# Patient Record
Sex: Female | Born: 1960 | Race: White | Hispanic: No | Marital: Married | Smoking: Never smoker
Health system: Southern US, Community
[De-identification: ages and names within clinical notes are randomized; demographics above are authoritative.]

## PROBLEM LIST (undated history)

## (undated) DIAGNOSIS — F419 Anxiety disorder, unspecified: Secondary | ICD-10-CM

## (undated) DIAGNOSIS — D649 Anemia, unspecified: Secondary | ICD-10-CM

## (undated) DIAGNOSIS — E119 Type 2 diabetes mellitus without complications: Secondary | ICD-10-CM

## (undated) DIAGNOSIS — K219 Gastro-esophageal reflux disease without esophagitis: Secondary | ICD-10-CM

## (undated) DIAGNOSIS — E611 Iron deficiency: Secondary | ICD-10-CM

## (undated) DIAGNOSIS — IMO0001 Reserved for inherently not codable concepts without codable children: Secondary | ICD-10-CM

## (undated) HISTORY — DX: Anxiety disorder, unspecified: F41.9

## (undated) HISTORY — DX: Iron deficiency: E61.1

## (undated) HISTORY — DX: Type 2 diabetes mellitus without complications: E11.9

## (undated) HISTORY — DX: Anemia, unspecified: D64.9

## (undated) HISTORY — PX: TONSILLECTOMY: SUR1361

## (undated) HISTORY — DX: Reserved for inherently not codable concepts without codable children: IMO0001

## (undated) HISTORY — DX: Gastro-esophageal reflux disease without esophagitis: K21.9

---

## 1998-12-25 ENCOUNTER — Other Ambulatory Visit: Admission: RE | Admit: 1998-12-25 | Discharge: 1998-12-25 | Payer: Self-pay | Admitting: Family Medicine

## 2000-11-18 ENCOUNTER — Encounter: Admission: RE | Admit: 2000-11-18 | Discharge: 2000-11-18 | Payer: Self-pay | Admitting: Family Medicine

## 2000-11-18 ENCOUNTER — Encounter: Payer: Self-pay | Admitting: Family Medicine

## 2001-01-20 ENCOUNTER — Other Ambulatory Visit: Admission: RE | Admit: 2001-01-20 | Discharge: 2001-01-20 | Payer: Self-pay | Admitting: Family Medicine

## 2002-01-23 ENCOUNTER — Encounter: Admission: RE | Admit: 2002-01-23 | Discharge: 2002-01-23 | Payer: Self-pay | Admitting: Family Medicine

## 2002-01-23 ENCOUNTER — Encounter: Payer: Self-pay | Admitting: Family Medicine

## 2002-02-09 ENCOUNTER — Other Ambulatory Visit: Admission: RE | Admit: 2002-02-09 | Discharge: 2002-02-09 | Payer: Self-pay | Admitting: Family Medicine

## 2003-01-29 ENCOUNTER — Encounter: Admission: RE | Admit: 2003-01-29 | Discharge: 2003-01-29 | Payer: Self-pay | Admitting: Family Medicine

## 2003-02-15 ENCOUNTER — Other Ambulatory Visit: Admission: RE | Admit: 2003-02-15 | Discharge: 2003-02-15 | Payer: Self-pay | Admitting: Family Medicine

## 2004-02-01 ENCOUNTER — Ambulatory Visit (HOSPITAL_COMMUNITY): Admission: RE | Admit: 2004-02-01 | Discharge: 2004-02-01 | Payer: Self-pay | Admitting: Family Medicine

## 2004-03-31 ENCOUNTER — Ambulatory Visit: Payer: Self-pay | Admitting: Family Medicine

## 2004-04-04 ENCOUNTER — Other Ambulatory Visit: Admission: RE | Admit: 2004-04-04 | Discharge: 2004-04-04 | Payer: Self-pay | Admitting: Family Medicine

## 2004-04-04 ENCOUNTER — Ambulatory Visit: Payer: Self-pay | Admitting: Family Medicine

## 2005-02-02 ENCOUNTER — Ambulatory Visit (HOSPITAL_COMMUNITY): Admission: RE | Admit: 2005-02-02 | Discharge: 2005-02-02 | Payer: Self-pay | Admitting: Family Medicine

## 2005-04-06 ENCOUNTER — Ambulatory Visit: Payer: Self-pay | Admitting: Family Medicine

## 2005-04-13 ENCOUNTER — Ambulatory Visit: Payer: Self-pay | Admitting: Family Medicine

## 2005-04-13 ENCOUNTER — Other Ambulatory Visit: Admission: RE | Admit: 2005-04-13 | Discharge: 2005-04-13 | Payer: Self-pay | Admitting: Family Medicine

## 2005-07-31 ENCOUNTER — Ambulatory Visit: Payer: Self-pay | Admitting: Family Medicine

## 2006-02-08 ENCOUNTER — Ambulatory Visit (HOSPITAL_COMMUNITY): Admission: RE | Admit: 2006-02-08 | Discharge: 2006-02-08 | Payer: Self-pay | Admitting: Family Medicine

## 2006-04-20 ENCOUNTER — Ambulatory Visit: Payer: Self-pay | Admitting: Family Medicine

## 2006-04-20 LAB — CONVERTED CEMR LAB
ALT: 19 units/L (ref 0–40)
Basophils Relative: 0.2 % (ref 0.0–1.0)
Bilirubin, Direct: 0.1 mg/dL (ref 0.0–0.3)
CO2: 27 meq/L (ref 19–32)
Calcium: 9.7 mg/dL (ref 8.4–10.5)
Eosinophils Absolute: 0.2 10*3/uL (ref 0.0–0.6)
Eosinophils Relative: 3.4 % (ref 0.0–5.0)
GFR calc Af Amer: 116 mL/min
Glucose, Bld: 122 mg/dL — ABNORMAL HIGH (ref 70–99)
HCT: 39.1 % (ref 36.0–46.0)
Hemoglobin: 13.5 g/dL (ref 12.0–15.0)
Hgb A1c MFr Bld: 5.5 % (ref 4.6–6.0)
Lymphocytes Relative: 22.1 % (ref 12.0–46.0)
MCV: 82.9 fL (ref 78.0–100.0)
Monocytes Absolute: 0.5 10*3/uL (ref 0.2–0.7)
Neutro Abs: 4.1 10*3/uL (ref 1.4–7.7)
Neutrophils Relative %: 66 % (ref 43.0–77.0)
Sodium: 140 meq/L (ref 135–145)
Total Protein: 7.1 g/dL (ref 6.0–8.3)
VLDL: 10 mg/dL (ref 0–40)
WBC: 6.2 10*3/uL (ref 4.5–10.5)

## 2006-04-27 ENCOUNTER — Encounter: Payer: Self-pay | Admitting: Family Medicine

## 2006-04-27 ENCOUNTER — Ambulatory Visit: Payer: Self-pay | Admitting: Family Medicine

## 2006-04-27 ENCOUNTER — Other Ambulatory Visit: Admission: RE | Admit: 2006-04-27 | Discharge: 2006-04-27 | Payer: Self-pay | Admitting: Family Medicine

## 2007-02-18 ENCOUNTER — Ambulatory Visit (HOSPITAL_COMMUNITY): Admission: RE | Admit: 2007-02-18 | Discharge: 2007-02-18 | Payer: Self-pay | Admitting: Family Medicine

## 2007-02-23 ENCOUNTER — Encounter (INDEPENDENT_AMBULATORY_CARE_PROVIDER_SITE_OTHER): Payer: Self-pay | Admitting: *Deleted

## 2007-07-12 ENCOUNTER — Ambulatory Visit: Payer: Self-pay | Admitting: Family Medicine

## 2007-07-12 LAB — CONVERTED CEMR LAB
ALT: 16 units/L (ref 0–35)
Albumin: 3.8 g/dL (ref 3.5–5.2)
Basophils Relative: 0.2 % (ref 0.0–1.0)
CO2: 29 meq/L (ref 19–32)
Calcium: 9.7 mg/dL (ref 8.4–10.5)
Cholesterol: 178 mg/dL (ref 0–200)
Creatinine, Ser: 0.8 mg/dL (ref 0.4–1.2)
Creatinine,U: 208.6 mg/dL
Glucose, Bld: 118 mg/dL — ABNORMAL HIGH (ref 70–99)
HCT: 35.8 % — ABNORMAL LOW (ref 36.0–46.0)
Hemoglobin: 12 g/dL (ref 12.0–15.0)
LDL Cholesterol: 128 mg/dL — ABNORMAL HIGH (ref 0–99)
Lymphocytes Relative: 24 % (ref 12.0–46.0)
MCHC: 33.4 g/dL (ref 30.0–36.0)
Microalb, Ur: 0.8 mg/dL (ref 0.0–1.9)
Monocytes Relative: 7.6 % (ref 3.0–12.0)
Neutro Abs: 4.6 10*3/uL (ref 1.4–7.7)
RBC: 4.57 M/uL (ref 3.87–5.11)
Total CHOL/HDL Ratio: 4.8
Total Protein: 6.8 g/dL (ref 6.0–8.3)

## 2007-07-15 ENCOUNTER — Other Ambulatory Visit: Admission: RE | Admit: 2007-07-15 | Discharge: 2007-07-15 | Payer: Self-pay | Admitting: Family Medicine

## 2007-07-15 ENCOUNTER — Ambulatory Visit: Payer: Self-pay | Admitting: Family Medicine

## 2007-07-15 ENCOUNTER — Encounter: Payer: Self-pay | Admitting: Family Medicine

## 2007-07-15 LAB — CONVERTED CEMR LAB: Pap Smear: NORMAL

## 2007-07-21 ENCOUNTER — Encounter (INDEPENDENT_AMBULATORY_CARE_PROVIDER_SITE_OTHER): Payer: Self-pay | Admitting: *Deleted

## 2007-07-28 ENCOUNTER — Telehealth: Payer: Self-pay | Admitting: Family Medicine

## 2007-11-11 ENCOUNTER — Telehealth (INDEPENDENT_AMBULATORY_CARE_PROVIDER_SITE_OTHER): Payer: Self-pay | Admitting: *Deleted

## 2008-01-02 ENCOUNTER — Encounter (INDEPENDENT_AMBULATORY_CARE_PROVIDER_SITE_OTHER): Payer: Self-pay | Admitting: *Deleted

## 2008-01-10 ENCOUNTER — Telehealth: Payer: Self-pay | Admitting: Family Medicine

## 2008-01-16 ENCOUNTER — Ambulatory Visit: Payer: Self-pay | Admitting: Family Medicine

## 2008-02-27 ENCOUNTER — Ambulatory Visit: Payer: Self-pay | Admitting: Family Medicine

## 2008-02-28 ENCOUNTER — Ambulatory Visit: Payer: Self-pay | Admitting: Family Medicine

## 2008-02-28 LAB — CONVERTED CEMR LAB
Basophils Absolute: 0 10*3/uL (ref 0.0–0.1)
Ferritin: 12.7 ng/mL (ref 10.0–291.0)
HCT: 37.6 % (ref 36.0–46.0)
Hemoglobin: 12.7 g/dL (ref 12.0–15.0)
MCHC: 33.8 g/dL (ref 30.0–36.0)
Monocytes Absolute: 0.4 10*3/uL (ref 0.1–1.0)
Monocytes Relative: 7.1 % (ref 3.0–12.0)
Neutro Abs: 3.5 10*3/uL (ref 1.4–7.7)
RDW: 12.6 % (ref 11.5–14.6)

## 2008-06-08 ENCOUNTER — Ambulatory Visit (HOSPITAL_COMMUNITY): Admission: RE | Admit: 2008-06-08 | Discharge: 2008-06-08 | Payer: Self-pay | Admitting: Family Medicine

## 2008-06-08 LAB — HM MAMMOGRAPHY: HM Mammogram: NORMAL

## 2008-08-06 ENCOUNTER — Ambulatory Visit: Payer: Self-pay | Admitting: Family Medicine

## 2008-08-06 DIAGNOSIS — I10 Essential (primary) hypertension: Secondary | ICD-10-CM

## 2008-09-25 ENCOUNTER — Ambulatory Visit: Payer: Self-pay | Admitting: Family Medicine

## 2008-09-25 LAB — CONVERTED CEMR LAB
ALT: 19 units/L (ref 0–35)
AST: 19 units/L (ref 0–37)
Albumin: 3.9 g/dL (ref 3.5–5.2)
Basophils Relative: 0.3 % (ref 0.0–3.0)
Eosinophils Relative: 3.4 % (ref 0.0–5.0)
GFR calc non Af Amer: 81.11 mL/min (ref >60)
Glucose, Bld: 126 mg/dL — ABNORMAL HIGH (ref 70–99)
HCT: 40.1 % (ref 36.0–46.0)
Hemoglobin: 13.6 g/dL (ref 12.0–15.0)
Lymphs Abs: 1.5 10*3/uL (ref 0.7–4.0)
Monocytes Relative: 7.7 % (ref 3.0–12.0)
Neutro Abs: 4.2 10*3/uL (ref 1.4–7.7)
Potassium: 3.9 meq/L (ref 3.5–5.1)
RBC: 4.63 M/uL (ref 3.87–5.11)
RDW: 12.3 % (ref 11.5–14.6)
Sodium: 139 meq/L (ref 135–145)
TSH: 1.43 microintl units/mL (ref 0.35–5.50)
Total Protein: 7.1 g/dL (ref 6.0–8.3)
VLDL: 10.6 mg/dL (ref 0.0–40.0)
WBC: 6.4 10*3/uL (ref 4.5–10.5)

## 2008-09-26 ENCOUNTER — Ambulatory Visit: Payer: Self-pay | Admitting: Family Medicine

## 2008-09-26 ENCOUNTER — Encounter: Payer: Self-pay | Admitting: Family Medicine

## 2008-09-26 ENCOUNTER — Other Ambulatory Visit: Admission: RE | Admit: 2008-09-26 | Discharge: 2008-09-26 | Payer: Self-pay | Admitting: Family Medicine

## 2008-09-26 DIAGNOSIS — E739 Lactose intolerance, unspecified: Secondary | ICD-10-CM

## 2008-09-26 DIAGNOSIS — E7800 Pure hypercholesterolemia, unspecified: Secondary | ICD-10-CM

## 2008-09-26 DIAGNOSIS — K219 Gastro-esophageal reflux disease without esophagitis: Secondary | ICD-10-CM

## 2008-09-26 DIAGNOSIS — F411 Generalized anxiety disorder: Secondary | ICD-10-CM

## 2008-09-26 DIAGNOSIS — D509 Iron deficiency anemia, unspecified: Secondary | ICD-10-CM | POA: Insufficient documentation

## 2008-09-26 LAB — HM PAP SMEAR

## 2008-09-26 LAB — CONVERTED CEMR LAB: Pap Smear: NORMAL

## 2008-10-02 ENCOUNTER — Encounter (INDEPENDENT_AMBULATORY_CARE_PROVIDER_SITE_OTHER): Payer: Self-pay | Admitting: *Deleted

## 2008-12-25 ENCOUNTER — Telehealth: Payer: Self-pay | Admitting: Family Medicine

## 2009-01-02 ENCOUNTER — Telehealth: Payer: Self-pay | Admitting: Family Medicine

## 2009-01-02 ENCOUNTER — Encounter: Payer: Self-pay | Admitting: Family Medicine

## 2009-05-28 ENCOUNTER — Telehealth: Payer: Self-pay | Admitting: Family Medicine

## 2009-10-02 ENCOUNTER — Encounter (INDEPENDENT_AMBULATORY_CARE_PROVIDER_SITE_OTHER): Payer: Self-pay | Admitting: *Deleted

## 2009-10-03 ENCOUNTER — Telehealth: Payer: Self-pay | Admitting: Family Medicine

## 2009-10-14 ENCOUNTER — Ambulatory Visit: Payer: Self-pay | Admitting: Family Medicine

## 2010-01-01 ENCOUNTER — Telehealth: Payer: Self-pay | Admitting: Family Medicine

## 2010-01-08 ENCOUNTER — Telehealth: Payer: Self-pay | Admitting: Family Medicine

## 2010-03-10 ENCOUNTER — Other Ambulatory Visit: Payer: Self-pay | Admitting: Family Medicine

## 2010-03-10 ENCOUNTER — Encounter: Payer: Self-pay | Admitting: Family Medicine

## 2010-03-10 ENCOUNTER — Ambulatory Visit
Admission: RE | Admit: 2010-03-10 | Discharge: 2010-03-10 | Payer: Self-pay | Source: Home / Self Care | Attending: Family Medicine | Admitting: Family Medicine

## 2010-03-10 LAB — CBC WITH DIFFERENTIAL/PLATELET
Basophils Absolute: 0 10*3/uL (ref 0.0–0.1)
Basophils Relative: 0.5 % (ref 0.0–3.0)
Eosinophils Absolute: 0.3 10*3/uL (ref 0.0–0.7)
Eosinophils Relative: 3.8 % (ref 0.0–5.0)
HCT: 41.7 % (ref 36.0–46.0)
Hemoglobin: 14.4 g/dL (ref 12.0–15.0)
Lymphocytes Relative: 22.9 % (ref 12.0–46.0)
Lymphs Abs: 1.8 10*3/uL (ref 0.7–4.0)
MCHC: 34.5 g/dL (ref 30.0–36.0)
MCV: 83.8 fl (ref 78.0–100.0)
Monocytes Absolute: 0.5 10*3/uL (ref 0.1–1.0)
Monocytes Relative: 6.3 % (ref 3.0–12.0)
Neutro Abs: 5.1 10*3/uL (ref 1.4–7.7)
Neutrophils Relative %: 66.5 % (ref 43.0–77.0)
Platelets: 298 10*3/uL (ref 150.0–400.0)
RBC: 4.97 Mil/uL (ref 3.87–5.11)
RDW: 13.4 % (ref 11.5–14.6)
WBC: 7.7 10*3/uL (ref 4.5–10.5)

## 2010-03-10 LAB — BASIC METABOLIC PANEL
BUN: 13 mg/dL (ref 6–23)
CO2: 29 mEq/L (ref 19–32)
Calcium: 10.1 mg/dL (ref 8.4–10.5)
Chloride: 104 mEq/L (ref 96–112)
Creatinine, Ser: 0.7 mg/dL (ref 0.4–1.2)
GFR: 95.63 mL/min (ref >60.00)
Glucose, Bld: 128 mg/dL — ABNORMAL HIGH (ref 70–99)
Potassium: 4.5 mEq/L (ref 3.5–5.1)
Sodium: 140 mEq/L (ref 135–145)

## 2010-03-10 LAB — LIPID PANEL
Cholesterol: 193 mg/dL (ref 0–200)
HDL: 42.2 mg/dL (ref >39.00)
LDL Cholesterol: 137 mg/dL — ABNORMAL HIGH (ref 0–99)
Total CHOL/HDL Ratio: 5
Triglycerides: 68 mg/dL (ref 0.0–149.0)
VLDL: 13.6 mg/dL (ref 0.0–40.0)

## 2010-03-10 LAB — VITAMIN B12: Vitamin B-12: 311 pg/mL (ref 211–911)

## 2010-03-10 LAB — MICROALBUMIN / CREATININE URINE RATIO
Creatinine,U: 137 mg/dL
Microalb Creat Ratio: 0.4 mg/g (ref 0.0–30.0)
Microalb, Ur: 0.6 mg/dL (ref 0.0–1.9)

## 2010-03-10 LAB — HEPATIC FUNCTION PANEL
ALT: 19 U/L (ref 0–35)
AST: 18 U/L (ref 0–37)
Albumin: 3.9 g/dL (ref 3.5–5.2)
Alkaline Phosphatase: 83 U/L (ref 39–117)
Bilirubin, Direct: 0.1 mg/dL (ref 0.0–0.3)
Total Bilirubin: 0.7 mg/dL (ref 0.3–1.2)
Total Protein: 7.3 g/dL (ref 6.0–8.3)

## 2010-03-10 LAB — IRON: Iron: 85 ug/dL (ref 42–145)

## 2010-03-10 LAB — TSH: TSH: 1.52 u[IU]/mL (ref 0.35–5.50)

## 2010-03-11 LAB — CONVERTED CEMR LAB: Vit D, 25-Hydroxy: 32 ng/mL (ref 30–89)

## 2010-03-13 ENCOUNTER — Ambulatory Visit
Admission: RE | Admit: 2010-03-13 | Discharge: 2010-03-13 | Payer: Self-pay | Source: Home / Self Care | Attending: Family Medicine | Admitting: Family Medicine

## 2010-04-01 NOTE — Progress Notes (Signed)
Summary: BP is elevated  Phone Note Call from Patient Call back at [phone]   Caller: Patient Summary of Call: Pt states her father has passed and her BP is up due to stress.  It's running around 150-160/ 93-95.  She is asking if this will be ok for a few days while she gets through the wake and funeral.  She says she feels ok otherwise.  She will come in next week for office visit if necessary. Initial call taken by: Lowella Petties CMA, AAMA,  January 01, 2010 12:17 PM  Follow-up for Phone Call        Discussed with pt. BP ranging from 120-160/75-90s, not going up or down too fast but definitely related to stress level associated with her father's funeral arrangements. Told her to level her emotions as much as poss and see me if conts high next week. Follow-up by: Shaune Leeks MD,  January 01, 2010 1:23 PM

## 2010-04-01 NOTE — Progress Notes (Signed)
Summary: regarding labs  Phone Note Call from Patient Call back at Home Phone [phone] Call back at [phone]   Caller: Patient Call For: Shaune Leeks MD Summary of Call: Pt was due for labs and physical in july but she cant get in to see you until january.  She is asking if you think it's ok for her to wait that long.  I told her it would be but she wanted me to check with you. Initial call taken by: Lowella Petties CMA, AAMA,  January 08, 2010 2:24 PM  Follow-up for Phone Call        Fine with her physical well being to wait that long!! That is a good thing! Follow-up by: Shaune Leeks MD,  January 08, 2010 2:54 PM  Additional Follow-up for Phone Call Additional follow up Details #1::        Advised pt. Additional Follow-up by: Lowella Petties CMA, AAMA,  January 08, 2010 3:02 PM

## 2010-04-01 NOTE — Letter (Signed)
Summary: Sara Leonard letter  [facility] at [facility]  [address] [place], [place] [address]   Phone: [phone]  Fax: [phone]       10/02/2009 MRN: [id number]  [facility] [address] [place], [place]  [address]  Dear Ms. [facility] [place] and [place] announce the retirement of Arta Silence, M.D., from full-time practice at the [facility] office effective August 29, 2009 and his plans of returning part-time.  It is important to Dr. Hetty Ely and to our practice that you understand that [facility] has seven physicians in our office for your health care needs.  We will continue to offer the same exceptional care that you have today.    Dr. Hetty Ely has spoken to many of you about his plans for retirement and returning part-time in the fall.   We will continue to work with you through the transition to schedule appointments for you in the office and meet the high standards that [facility] is committed to.   Again, it is with great pleasure that we share the news that Dr. Hetty Ely will return to [facility] in October of 2011 with a reduced schedule.    If you have any questions, or would like to request an appointment with one of our physicians, please call us at [phone] and press the option for Scheduling an appointment.  We take pleasure in providing you with excellent patient care and look forward to seeing you at your next office visit.  Our [facility] Physicians are:  Tillman Abide, M.D. Laurita Quint, M.D. Roxy Manns, M.D. Kerby Nora, M.D. Hannah Beat, M.D. Ruthe Mannan, M.D. We proudly welcomed Raechel Ache, M.D. and Eustaquio Boyden, M.D. to the practice in July/August 2011.  Sincerely,  [facility]

## 2010-04-01 NOTE — Progress Notes (Signed)
Summary: pt wants lab work  Phone Note Call from Patient Call back at [employer] [phone] Call back at [phone]   Caller: Patient Summary of Call: Pt is asking if she can come in for labs.  She does not have any appts scheduled with anyone, says she saw her gyn for her physical. Initial call taken by: Lowella Petties CMA,  October 03, 2009 3:40 PM  Follow-up for Phone Call        cmet/lipid 272.0 make sure we got a copy of her labs from gyn.   have her schedule appointment after labs are drawn.  last OV was 1 year ago.  Follow-up by: Crawford Givens MD,  October 03, 2009 11:24 PM  Additional Follow-up for Phone Call Additional follow up Details #1::        Patient Advised.   Lab appointment scheduled:  11/25/2009 and follow up opn 11/26/2009 . Additional Follow-up by: Delilah Shan CMA (AAMA),  October 04, 2009 10:25 AM

## 2010-04-01 NOTE — Assessment & Plan Note (Signed)
Summary: LEFT SIDE PAIN...CYD   Vital Signs:  Patient profile:   50 year old female Height:      60 inches Weight:      183.50 pounds BMI:     35.97 Temp:     98.2 degrees F oral Pulse rate:   92 / minute Pulse rhythm:   regular BP sitting:   182 / 100  (left arm) Cuff size:   regular  Vitals Entered By: Delilah Shan CMA Heidi Lemay Dull) (October 14, 2009 3:10 PM) CC: Left side pain, Back Pain   History of Present Illness: L abdominal pain under anterior rib cage.  Throbbing and dull.  Intermittent.  Has helped move son into college this weekend.  Started yesterday.  No FCNAVD.  No rash.  No change with meals; not worse after eating.  No dysuria.   No VB other than normal periods, no discharge.    recheck of BP 170/100, 130/80 this AM per patient.    Allergies: 1)  ! * Penicillin  Past History:  Past Medical History: white coat HTN  Past Surgical History: Tonsillectomy as a child Opn for tendonitis in HS. C/S x2  Review of Systems       See HPI.  Otherwise negative.    Physical Exam  General:  GEN: nad, alert and oriented HEENT: mucous membranes moist NECK: supple w/o LA CV: rrr.  no murmur PULM: ctab, no inc wob ABD: soft, +bs, minimally tender to palpation in LUQ at the inferior margin of ribs.  worse with oblique testing, no rebound, no CVA pain EXT: no edema SKIN: no acute rash    Impression & Recommendations:  Problem # 1:  ABDOMINAL WALL PAIN (ICD-789.09) LIkely oblique strain.  follow up as needed.  d/w patient that this should gradually improve.  No signs of other systemic illness.   Complete Medication List: 1)  Omeprazole 20 Mg Cpdr (Omeprazole) .... One tab by mouth 45 mins before brfst and supper. 2)  Ferrous Sulfate 325 (65 Fe) Mg Tbec (Ferrous sulfate) .Marland Kitchen.. 1 daily by mouth  2 at timesper patient  Patient Instructions: 1)  Call if you have fever or increase in pain.  Come back as scheduled for labs .  Current Allergies (reviewed today): ! *  PENICILLIN

## 2010-04-01 NOTE — Progress Notes (Signed)
Summary: who should pt see?  Phone Note Call from Patient   Caller: Patient Call For: Shaune Leeks MD Summary of Call: Pt wants your opinion on who she should start seeing after you retire.  She want someone like you. Initial call taken by: Lowella Petties CMA,  May 28, 2009 8:43 AM  Follow-up for Phone Call        Let her know I will be gone Jul, Aug and Sep and then start parttime in Oct.  If that worksd for her, fine. If not, suggest Dr Para March. Follow-up by: Shaune Leeks MD,  May 28, 2009 9:51 AM

## 2010-04-03 NOTE — Assessment & Plan Note (Signed)
Summary: CPX/CLE   Vital Signs:  Patient profile:   50 year old female Weight:      180.75 pounds Temp:     99.0 degrees F oral Pulse rate:   88 / minute Pulse rhythm:   regular BP sitting:   122 / 80  (left arm) Cuff size:   large  Vitals Entered By: Sydell Axon LPN (March 13, 2010 1:44 PM) CC: 30 Minute checkup, saw a GYN 08/11 and  had a pap smear and mammogram which were normal per patient   History of Present Illness: Pt here for Comp Exam. Had pap via Gyn in my abscence while retired which was nml and had mammo done 8/11 which was nml. She is under lots of stress lately, father died 01-24-10. In laws sick. Son just graduated from HS.  She has congestion today and began with throat congestion Tues (2 days ago) and feels like glob in the throat. She has not had a period since turning 49...sounds to be officially into menopause. Son graduated from [employer] after lots of difficulty with OCD and abhorence of risk of infection.  Preventive Screening-Counseling & Management  Alcohol-Tobacco     Alcohol drinks/day: 0     Smoking Status: never     Passive Smoke Exposure: no  Caffeine-Diet-Exercise     Caffeine use/day: 0     Does Patient Exercise: yes     Type of exercise: Gym     Times/week: 3  Problems Prior to Update: 1)  Abdominal Wall Pain  (ICD-789.09) 2)  Low Back Pain, Chronic  (ICD-724.2) 3)  Elevated Blood Pressure Without Diagnosis of Hypertension  (ICD-796.2) 4)  Anxiety Disorder  (ICD-300.00) 5)  Health Maintenance Exam  (ICD-V70.0) 6)  Glucose Intolerance  (ICD-271.3) 7)  Gerd  (ICD-530.81) 8)  Anemia, Iron Deficiency  (ICD-280.9) 9)  Hypercholesterolemia  (ICD-272.0)  Medications Prior to Update: 1)  Omeprazole 20 Mg Cpdr (Omeprazole) .... One Tab By Mouth 45 Mins Before Brfst and Supper. 2)  Ferrous Sulfate 325 (65 Fe) Mg Tbec (Ferrous Sulfate) .Marland Kitchen.. 1 Daily By Mouth  2 At Timesper Patient  Current Medications (verified): 1)  Omeprazole 20 Mg Cpdr  (Omeprazole) .... One Tab By Mouth 45 Mins Before Brfst and Supper. 2)  Ferrous Sulfate 325 (65 Fe) Mg Tbec (Ferrous Sulfate) .Marland Kitchen.. 1 Daily By Mouth  2 At Timesper Patient  Allergies: 1)  ! * Penicillin  Past History:  Past Medical History: Last updated: 10/14/2009 white coat HTN  Past Surgical History: Last updated: 10/14/2009 Tonsillectomy as a child Opn for tendonitis in HS. C/S x2  Family History: Last updated: 03/13/2010 Father dec 76 MI  CAD DM Renal Insuff HTn Chol Mother A 59 DM Htn  Brother A 48   Social History: Last updated: 09/26/2008 Occupation:Hairdresser Married lives with husband, 2 sons  Risk Factors: Alcohol Use: 0 (03/13/2010) Caffeine Use: 0 (03/13/2010) Exercise: yes (03/13/2010)  Risk Factors: Smoking Status: never (03/13/2010) Passive Smoke Exposure: no (03/13/2010)  Family History: Father dec 76 MI  CAD DM Renal Insuff HTn Chol Mother A 70 DM Htn  Brother A 48   Review of Systems General:  Denies chills, fatigue, fever, sweats, weakness, and weight loss. Eyes:  Denies blurring, discharge, and eye pain. ENT:  Complains of ringing in ears; denies decreased hearing and earache; occas. CV:  Complains of palpitations; denies chest pain or discomfort, fainting, fatigue, shortness of breath with exertion, swelling of feet, and swelling of hands. Resp:  Denies cough, shortness  of breath, and wheezing. GI:  Denies abdominal pain, bloody stools, change in bowel habits, constipation, dark tarry stools, diarrhea, indigestion, loss of appetite, nausea, vomiting, vomiting blood, and yellowish skin color. GU:  Denies discharge, dysuria, nocturia, and urinary frequency. MS:  Complains of joint pain and loss of strength; denies muscle aches, cramps, and muscle weakness; neck occas, miun back pain . Derm:  Denies dryness, itching, and rash. Neuro:  Denies numbness, poor balance, tingling, and tremors.  Physical Exam  General:   Well-developed,well-nourished,in no acute distress; alert,appropriate and cooperative throughout examination, mildly overweight. Head:  Normocephalic and atraumatic without obvious abnormalities. No apparent alopecia or balding. Sinuses NT. Eyes:  Conjunctiva clear bilaterally.  Ears:  External ear exam shows no significant lesions or deformities.  Otoscopic examination reveals clear canals, tympanic membranes are intact bilaterally without bulging, retraction, inflammation or discharge. Hearing is grossly normal bilaterally. Nose:  External nasal examination shows no deformity or inflammation. Nasal mucosa are pink and moist without lesions or exudates. Mouth:  Oral mucosa and oropharynx without lesions or exudates.  Teeth in good repair. Neck:  No deformities, masses, or tenderness noted. Chest Wall:  No deformities, masses, or tenderness noted. Breasts:  Done by Clayton Bibles, 8/11. Lungs:  Normal respiratory effort, chest expands symmetrically. Lungs are clear to auscultation, no crackles or wheezes. Heart:  Normal rate and regular rhythm. S1 and S2 normal without gallop, murmur, click, rub or other extra sounds. Abdomen:  Bowel sounds positive,abdomen soft and non-tender without masses, organomegaly or hernias noted. Rectal:  Done by Arnette Felts. Genitalia:  Done by Clayton Bibles 8/11. Msk:  No deformity or scoliosis noted of thoracic or lumbar spine.   Pulses:  R and L carotid,radial,femoral,dorsalis pedis and posterior tibial pulses are full and equal bilaterally Extremities:  No clubbing, cyanosis, edema, or deformity noted with normal full range of motion of all joints.   Neurologic:  No cranial nerve deficits noted. Station and gait are normal. Sensory, motor and coordinative functions appear intact. Skin:  Intact without suspicious lesions or rashes, small benign erythematous macule on lat side of left lower leg. Cervical Nodes:  No lymphadenopathy noted Inguinal Nodes:  No significant adenopathy Psych:   Cognition and judgment appear intact. Alert and cooperative with normal attention span and concentration. No apparent delusions, illusions, hallucinations   Impression & Recommendations:  Problem # 1:  HEALTH MAINTENANCE EXAM (ICD-V70.0) Assessment Comment Only Will come back for entire PE including breast/pelvic next year. Has started into menopause.  Problem # 2:  ELEVATED BLOOD PRESSURE WITHOUT DIAGNOSIS OF HYPERTENSION (ICD-796.2) Assessment: Improved Much better today. Has handled anxiety and stressful situations well in the last year. BP today: 122/80 Prior BP: 182/100 (10/14/2009)  Labs Reviewed: Creat: 0.7 (03/10/2010) Chol: 193 (03/10/2010)   HDL: 42.20 (03/10/2010)   LDL: 137 (03/10/2010)   TG: 68.0 (03/10/2010)  Instructed in low sodium diet (DASH Handout) and behavior modification.    Problem # 3:  ANXIETY DISORDER (ICD-300.00) Assessment: Unchanged Stable altho significantly tried recently.  Problem # 4:  GLUCOSE INTOLERANCE (ICD-271.3) Assessment: Deteriorated May very well have tipped  over into overt diabetes. Will recheck in 6 weeks with FBS and A1C. Reassured and told to be very compliant until then.  Problem # 5:  ANEMIA, IRON DEFICIENCY (ICD-280.9) Assessment: Unchanged  Not having periods anymore and iron stores good. May stop iron replacement. The following medications were removed from the medication list:    Ferrous Sulfate 325 (65 Fe) Mg Tbec (Ferrous sulfate) .Marland Kitchen... 1 daily  by mouth  2 at timesper patient  Hgb: 14.4 (03/10/2010)   Hct: 41.7 (03/10/2010)   Platelets: 298.0 (03/10/2010) RBC: 4.97 (03/10/2010)   RDW: 13.4 (03/10/2010)   WBC: 7.7 (03/10/2010) MCV: 83.8 (03/10/2010)   MCHC: 34.5 (03/10/2010) Ferritin: 12.7 (02/27/2008) Iron: 85 (03/10/2010)   B12: 311 (03/10/2010)   TSH: 1.52 (03/10/2010)  Problem # 6:  HYPERCHOLESTEROLEMIA (ICD-272.0) Assessment: Unchanged Adequate for now unless becomes diabetic. Labs Reviewed: SGOT: 18  (03/10/2010)   SGPT: 19 (03/10/2010)   HDL:42.20 (03/10/2010), 38.7 (09/26/2008)  LDL:137 (03/10/2010), 118 (09/26/2008)  Chol:193 (03/10/2010), 167 (09/26/2008)  Trig:68.0 (03/10/2010), 50 (09/26/2008)  Complete Medication List: 1)  Omeprazole 20 Mg Cpdr (Omeprazole) .... One tab by mouth 45 mins before brfst and supper.  Patient Instructions: 1)  RTC 6 weeks with A1C and Fasting Blood Sugar prior. 271.3 2)  Take Guaifenesin by going to [facility] or [facility] and getting MUCOUS RELIEF EXPECTORANT (400mg ), take 11/2 tabs by mouth AM and NOON. 3)  Drink lots of fluids anytime taking Guaifenesin.  Prescriptions: OMEPRAZOLE 20 MG CPDR (OMEPRAZOLE) one tab by mouth 45 mins before brfst and supper.  #60 x 12   Entered and Authorized by:   Shaune Leeks MD   Signed by:   Shaune Leeks MD on 03/13/2010   Method used:   Electronically to        [facility]* (retail)       [address] [place], [place]  [address]       Ph: [phone] or [phone]       Fax: [phone]   RxID:   [id number]    Orders Added: 1)  Est. Patient 40-64 years [phone]    Current Allergies (reviewed today): ! * PENICILLIN  Appended Document: CPX/CLE Needs stool card IFOB next time.

## 2010-04-21 ENCOUNTER — Other Ambulatory Visit: Payer: Self-pay

## 2010-04-23 ENCOUNTER — Ambulatory Visit: Payer: Self-pay | Admitting: Family Medicine

## 2010-05-13 ENCOUNTER — Other Ambulatory Visit: Payer: Self-pay | Admitting: Surgery

## 2010-10-13 ENCOUNTER — Encounter: Payer: Self-pay | Admitting: Family Medicine

## 2010-10-13 ENCOUNTER — Ambulatory Visit (INDEPENDENT_AMBULATORY_CARE_PROVIDER_SITE_OTHER): Payer: BC Managed Care – PPO | Admitting: Family Medicine

## 2010-10-13 ENCOUNTER — Ambulatory Visit (INDEPENDENT_AMBULATORY_CARE_PROVIDER_SITE_OTHER)
Admission: RE | Admit: 2010-10-13 | Discharge: 2010-10-13 | Disposition: A | Discharge status: Home / Self Care | Payer: BC Managed Care – PPO | Source: Ambulatory Visit | Attending: Family Medicine | Admitting: Family Medicine

## 2010-10-13 VITALS — BP 130/90 | HR 103 | Temp 98.3°F | Wt 183.0 lb

## 2010-10-13 DIAGNOSIS — M25569 Pain in unspecified knee: Secondary | ICD-10-CM

## 2010-10-13 DIAGNOSIS — M25562 Pain in left knee: Secondary | ICD-10-CM

## 2010-10-13 DIAGNOSIS — M2390 Unspecified internal derangement of unspecified knee: Secondary | ICD-10-CM | POA: Insufficient documentation

## 2010-10-13 MED ORDER — TRAMADOL HCL 50 MG PO TABS: 50.0000 mg | ORAL_TABLET | Freq: Four times a day (QID) | ORAL | Status: AC | PRN

## 2010-10-13 NOTE — Patient Instructions (Signed)
F/u 3-4 weeks

## 2010-10-13 NOTE — Progress Notes (Signed)
  Subjective:    Patient ID: Sara Leonard, female    DOB: Feb 01, 1961, 50 y.o.   MRN: [id number]  HPI  Sara Leonard, a 50 y.o. female presents today in the office for the following:    Was on vacation the week of July 4th, and had a shimmy in the pool. Was OK for about three or four hours. B the time went to dinner, could hardly bend her knee. The week of the fourth, and hurting.  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 If straightens knee, will hurt a lot.   Unable to fully straighten knee. Pain with flexion. No effusion. Posterior fullness.  No prior operations.  I have reviewed the patient's medical history in detail and updated the computerized patient record.  Review of Systems REVIEW OF SYSTEMS  GEN: No fevers, chills. Nontoxic. Primarily MSK c/o today. MSK: Detailed in the HPI GI: tolerating PO intake without difficulty Neuro: No numbness, parasthesias, or tingling associated. Otherwise the pertinent positives of the ROS are noted above.      Objective:   Physical Exam   Physical Exam  Blood pressure 130/90, pulse 103, temperature 98.3 F (36.8 C), temperature source Oral, weight 183 lb (83.008 kg), SpO2 97.00%.  GEN: Well-developed,well-nourished,in no acute distress; alert,appropriate and cooperative throughout examination HEENT: Normocephalic and atraumatic without obvious abnormalities. Ears, externally no deformities PULM: Breathing comfortably in no respiratory distress EXT: No clubbing, cyanosis, or edema PSYCH: Normally interactive. Cooperative during the interview. Pleasant. Friendly and conversant. Not anxious or depressed appearing. Normal, full affect.  Gait: antalgic ROM: loss 4 deg extension to 110 Effusion: neg Echymosis or edema: none Patellar tendon NT Painful PLICA: neg Patellar grind: negative Medial and lateral patellar facet loading: negative medial and lateral joint lines: TTP posterior medial joint  line Mcmurray's POS Flexion-pinch POS BOUNCE HOME POS APLEY'S POS Varus and valgus stress: stable Lachman: neg Ant and Post drawer: neg Hip abduction, IR, ER: WNL Hip flexion str: 5/5 Hip abd: 5/5 Quad: 5/5 VMO atrophy:No Hamstring concentric and eccentric: 5/5 Some pain with popliteus stress       Assessment & Plan:   1. Knee pain, left  traMADol (ULTRAM) 50 MG tablet, DG Knee 1-2 Views Left, DG Knee Bilateral Standing AP  2. Knee internal derangement     Suspect meniscal injury Check XR  Motrin and tramadol for now  Recheck in 1 mo Declined knee inj -- multiple allergies in past to propylene gylcol, anesthetics.  If doing poorly at recheck, would MR knee.

## 2010-10-14 ENCOUNTER — Telehealth: Payer: Self-pay | Admitting: *Deleted

## 2010-10-14 NOTE — Telephone Encounter (Signed)
Patient  Advised

## 2010-10-14 NOTE — Telephone Encounter (Signed)
Call --- I want you to call and speak to this patient directly, not a message and not on phone tree  No significant arthritis on xrays - they look good.

## 2010-10-14 NOTE — Telephone Encounter (Signed)
Patient called requesting her x-ray results.  Please use phone number listed to advise pt.

## 2010-11-10 ENCOUNTER — Other Ambulatory Visit: Payer: Self-pay | Admitting: Orthopedic Surgery

## 2010-11-10 DIAGNOSIS — M25562 Pain in left knee: Secondary | ICD-10-CM

## 2010-12-12 ENCOUNTER — Other Ambulatory Visit: Payer: Self-pay | Admitting: Obstetrics and Gynecology

## 2011-03-05 ENCOUNTER — Telehealth: Payer: Self-pay | Admitting: Internal Medicine

## 2011-03-05 DIAGNOSIS — R739 Hyperglycemia, unspecified: Secondary | ICD-10-CM

## 2011-03-05 DIAGNOSIS — D509 Iron deficiency anemia, unspecified: Secondary | ICD-10-CM

## 2011-03-05 NOTE — Telephone Encounter (Signed)
Patient would like her yearly blood work scheduled.

## 2011-03-05 NOTE — Telephone Encounter (Signed)
Schedule her for a physical with the lab visit ahead of time.

## 2011-03-05 NOTE — Telephone Encounter (Signed)
Appts scheduled:

## 2011-03-14 ENCOUNTER — Ambulatory Visit (INDEPENDENT_AMBULATORY_CARE_PROVIDER_SITE_OTHER): Payer: BC Managed Care – PPO | Admitting: Internal Medicine

## 2011-03-14 ENCOUNTER — Encounter: Payer: Self-pay | Admitting: Internal Medicine

## 2011-03-14 DIAGNOSIS — L299 Pruritus, unspecified: Secondary | ICD-10-CM

## 2011-03-14 DIAGNOSIS — R21 Rash and other nonspecific skin eruption: Secondary | ICD-10-CM

## 2011-03-14 MED ORDER — RANITIDINE HCL 150 MG PO CAPS: 150.0000 mg | ORAL_CAPSULE | Freq: Two times a day (BID) | ORAL | Status: DC

## 2011-03-14 MED ORDER — HYDROXYZINE HCL 10 MG PO TABS: 10.0000 mg | ORAL_TABLET | Freq: Three times a day (TID) | ORAL | Status: DC | PRN

## 2011-03-14 NOTE — Progress Notes (Signed)
  Subjective:    Patient ID: Sara Leonard, female    DOB: 09-03-1960, 51 y.o.   MRN: [id number]  HPI RASH: Location: shins  Onset: 1 week ago    Course: stable but also upper legs & elbows Self-treated with: Eucerin             Improvement with treatment:de creased itching History Pruritis: yes,   Tenderness: no  New medications/antibiotics: no  Tick/insect/pet exposure: no new exposures Recent travel: no  New detergent, new clothing, or other topical exposure: no but washes sheets with Chlorox; she's also used Neutrogena body wash. In the past she used Dove w/o issues  [employer] Feeling ill: no  Fever: no  Mouth lesions: no  Facial/tongue swelling/difficulty breathing:  no  Diabetic or immunocompromised: no       Review of Systems she states that  Cortaid caused burning of her skin; she states she was able to use one steroid cream but cannot remember the name.. She admits to increased stress related to her son's health issues.     Objective:   Physical Exam Gen.: Healthy and well-nourished in appearance. Alert, appropriate and cooperative throughout exam.  Eyes: No corneal or conjunctival inflammation noted.  Nose: External nasal exam reveals no deformity or inflammation. Nasal mucosa are pink and moist. No lesions or exudates noted.  Mouth: Oral mucosa and oropharynx reveal no lesions or exudates. Teeth in good repair.  Lungs: Normal respiratory effort; chest expands symmetrically. Lungs are clear to auscultation without rales, wheezes, or increased work of breathing. Heart: Normal rate and rhythm. Normal S1 and S2. No gallop, click, or rub. S4 Abdomen: Bowel sounds normal; abdomen soft and nontender. No masses, organomegaly or hernias noted.                                                                                  Musculoskeletal/extremities:  No clubbing, cyanosis, edema, or deformity noted. Nail health  good.   Skin: Intact without suspicious lesions or  rashes. Palpation reveals some irregular texture to the skin without evidence of cellulitis or folliculitis. Very mild dermatographia  present Lymph: No cervical, axillary lymphadenopathy present. Psych: Mood and affect are normal. Normally interactive                                                                                         Assessment & Plan:   #1 fine papular rash subcutaneously with itching. Most likely this is multifactorial; it is most likely due to drying, Clorox exposure and the new body wash.  Plan: See orders and recommendations

## 2011-03-14 NOTE — Patient Instructions (Addendum)
Stop the omeprazole while you're on the ranitidine 150 mg twice a day. Ranitidine helps block histamine; omeprazole does not. Verify  which steroid cream you're able to take and call your doctor for a refill.

## 2011-03-19 ENCOUNTER — Ambulatory Visit (INDEPENDENT_AMBULATORY_CARE_PROVIDER_SITE_OTHER): Payer: BC Managed Care – PPO | Admitting: Family Medicine

## 2011-03-19 ENCOUNTER — Encounter: Payer: Self-pay | Admitting: Family Medicine

## 2011-03-19 ENCOUNTER — Telehealth: Payer: Self-pay | Admitting: Family Medicine

## 2011-03-19 VITALS — BP 150/90 | HR 115 | Temp 98.1°F | Ht 60.0 in | Wt 181.0 lb

## 2011-03-19 DIAGNOSIS — I10 Essential (primary) hypertension: Secondary | ICD-10-CM

## 2011-03-19 MED ORDER — HYDROCHLOROTHIAZIDE 12.5 MG PO TABS: 12.5000 mg | ORAL_TABLET | Freq: Every day | ORAL | Status: DC

## 2011-03-19 NOTE — Telephone Encounter (Signed)
Triage Record Num: [id number] Operator: Sara Leonard Patient Name: Sara Leonard Call Date & Time: 03/19/2011 9:00:32AM Patient Phone: [phone] PCP: Sara Leonard Patient Gender: Female PCP Fax : Patient DOB: Jul 04, 1960 Practice Name: [facility] Reason for Call: Caller: Sara Leonard/Patient; PCP: Sara Leonard Sara Leonard); CB#: [phone]; Call regarding Questions Re: BP; pt states that she has a son that is OCD and she is under a lot of stress; did this when dad died; BP been running in the 140's/90-100; pulse is 100; BP is usually 140/80's; sx started 03/18/11; BP 139/80 this morning; triaged per Hypertension, Diagnosed or Suspected Guideline; See in 24 hr d/t a sudden elevation and has not been taking BP meds; appt made for 10:15am today with Dr Sara Leonard; will comply Protocol(s) Used: Hypertension, Diagnosed or Suspected Recommended Outcome per Protocol: See Provider within 24 hours Reason for Outcome: A sudden elevation in blood pressure AND has not been taking blood pressure medication as prescribed, or recently started, changed, or stopped taking prescription medication; or recently started taking nonprescription or alternative medicine Care Advice: Call EMS [phone] if new symptoms develop, such as severe shortness of breath, chest pain, change in mental status, acute neurologic deficit, seizure, visual disturbances, pulse rate > 120 / minute, or very irregular pulse. ~ ~ When speaking with provider, discuss symptoms or reasons for changing or not following treatment plan. ~ HEALTH PROMOTION / MAINTENANCE ~ SYMPTOM / CONDITION MANAGEMENT ~ CAUTIONS ~ List, or take, all current prescription(s), nonprescription or alternative medication(s) to provider for evaluation. LIFESTYLE MODIFICATION FOR HYPERTENSION: - Weight reduction will help lower blood pressure in individuals who are 10% or more above their ideal body weight. - Eat foods low in saturated fat and sugar, and high  in complex carbohydrates (vegetables, fruits, whole grains). - Drink alcohol only in moderate amounts (12 oz beer, 5 oz wine, or 1 1/2 oz of distilled alcohol such as vodka, gin, etc.) and not more than 2 drinks/day for men or 1 drink/day for women or lighter-weight persons. - Get at least 30 minutes of moderate aerobic exercise (using as much energy as walking 2 miles in 30 minutes) most days of the week, preferably daily. - Stop smoking to decrease risk for cardiovascular and pulmonary disease, as well as cancer. - Keep regularly scheduled appointments with your provider. ~ Medication Advice: - Discontinue all nonprescription and alternative medications, especially stimulants, until evaluated by provider. - Take prescribed medications as directed, following label instructions for the medication. - Do not change medications or dosing regimen until provider is consulted. - Know possible side effects of medication and what to do if they occur. - Tell provider all prescription, nonprescription or alternative medications that you take ~ 03/19/2011 9:15:09AM Page 1 of 1 [id number]

## 2011-03-19 NOTE — Progress Notes (Signed)
  Patient Name: Sara Leonard Date of Birth: 06-07-1960 Age: 51 y.o. Medical Record Number: [id number] Gender: female Date of Encounter: 03/19/2011  History of Present Illness:  Sara Leonard is a 51 y.o. very pleasant female patient who presents with the following:  140/95 Checked and the bottom went to over a 100  Top went to 170/100 She has had some intermittently elevated blood pressures in the past. She has not been on medication. Otherwise she is asymptomatic.   Past Medical History, Surgical History, Social History, Family History, Problem List, Medications, and Allergies have been reviewed and updated if relevant.  Review of Systems: As above. Occasional anxiety. No fever, chills, sweats. No chest pain or palpitations  Physical Examination: Filed Vitals:   03/19/11 1019  BP: 150/90  Pulse: 115  Temp: 98.1 F (36.7 C)  TempSrc: Oral  Height: 5' (1.524 m)  Weight: 181 lb (82.101 kg)  SpO2: 100%    Body mass index is 35.35 kg/(m^2).   GEN: WDWN, NAD, Non-toxic, A & O x 3 HEENT: Atraumatic, Normocephalic. Neck supple. No masses, No LAD. Ears and Nose: No external deformity. CV: RRR, No M/G/R. No JVD. No thrill. No extra heart sounds. PULM: CTA B, no wheezes, crackles, rhonchi. No retractions. No resp. distress. No accessory muscle use. EXTR: No c/c/e NEURO Normal gait.  PSYCH: Normally interactive. Conversant. Not depressed or anxious appearing.  Calm demeanor.   Pulse is 80 during my exam  Assessment and Plan:  1. Hypertension    New diagnosis  Start HCTZ. Check BP's, write down and f/u 3-4 weeks to recheck with PCP

## 2011-03-19 NOTE — Patient Instructions (Signed)
F/u Dr. Para March in 3-4 weeks

## 2011-03-21 ENCOUNTER — Encounter: Payer: Self-pay | Admitting: Family Medicine

## 2011-03-25 ENCOUNTER — Encounter: Payer: Self-pay | Admitting: Family Medicine

## 2011-03-25 ENCOUNTER — Ambulatory Visit (INDEPENDENT_AMBULATORY_CARE_PROVIDER_SITE_OTHER): Payer: BC Managed Care – PPO | Admitting: Family Medicine

## 2011-03-25 VITALS — BP 150/90 | HR 98 | Temp 99.0°F | Wt 177.5 lb

## 2011-03-25 DIAGNOSIS — J069 Acute upper respiratory infection, unspecified: Secondary | ICD-10-CM

## 2011-03-25 DIAGNOSIS — R000 Tachycardia, unspecified: Secondary | ICD-10-CM

## 2011-03-25 LAB — CBC WITH DIFFERENTIAL/PLATELET
Basophils Absolute: 0 10*3/uL (ref 0.0–0.1)
Basophils Relative: 0.4 % (ref 0.0–3.0)
Eosinophils Absolute: 0.1 10*3/uL (ref 0.0–0.7)
HCT: 39.8 % (ref 36.0–46.0)
Hemoglobin: 13.6 g/dL (ref 12.0–15.0)
Lymphs Abs: 1.1 10*3/uL (ref 0.7–4.0)
MCHC: 34.2 g/dL (ref 30.0–36.0)
Neutro Abs: 8 10*3/uL — ABNORMAL HIGH (ref 1.4–7.7)
RBC: 5.03 Mil/uL (ref 3.87–5.11)
RDW: 14.2 % (ref 11.5–14.6)

## 2011-03-25 LAB — BASIC METABOLIC PANEL
CO2: 27 mEq/L (ref 19–32)
Chloride: 100 mEq/L (ref 96–112)
Glucose, Bld: 120 mg/dL — ABNORMAL HIGH (ref 70–99)
Potassium: 3.6 mEq/L (ref 3.5–5.1)
Sodium: 138 mEq/L (ref 135–145)

## 2011-03-25 MED ORDER — DIAZEPAM 2 MG PO TABS: 2.0000 mg | ORAL_TABLET | Freq: Three times a day (TID) | ORAL | Status: AC | PRN

## 2011-03-25 NOTE — Patient Instructions (Signed)
REFERRAL: GO THE THE FRONT ROOM AT THE ENTRANCE OF OUR CLINIC, NEAR CHECK IN. ASK FOR MARION. SHE WILL HELP YOU SET UP YOUR REFERRAL. DATE: TIME:  

## 2011-03-25 NOTE — Progress Notes (Signed)
  Patient Name: Sara Leonard Date of Birth: 10/21/1960 Age: 51 y.o. Medical Record Number: [id number] Gender: female Date of Encounter: 03/25/2011  History of Present Illness:  Sara Leonard is a 51 y.o. very pleasant female patient who presents with the following:  The patient presents with a two-day history of cold symptoms. She does have some runny nose and some nasal congestion. No headache, fevers. No sore throat, no earache. No nausea, vomiting, or diarrhea. She does have a son who also is having some acute illness symptoms right now as well.  Recently, we started the patient on some hydrochlorothiazide due to some blood pressure. She has had some improvement with this, but it is mildly elevated today. She is also reported some intermittent elevated pulses.  Past Medical History, Surgical History, Social History, Family History, Problem List, Medications, and Allergies have been reviewed and updated if relevant.  Review of Systems: Sickness as above. No chest pain. No syncope. No sensation of palpitations. Tachycardia, mostly with anxiousness and with doing activities.  Physical Examination: Filed Vitals:   03/25/11 1216  BP: 150/90  Pulse: 98  Temp: 99 F (37.2 C)  TempSrc: Oral  Weight: 177 lb 8 oz (80.513 kg)    There is no height on file to calculate BMI.   Gen: WDWN, NAD; A & O x3, cooperative. Pleasant.Globally Non-toxic HEENT: Normocephalic and atraumatic. Throat clear, w/o exudate, R TM clear, L TM - good landmarks, No fluid present. rhinnorhea.  MMM Frontal sinuses: NT Max sinuses: NT NECK: Anterior cervical  LAD is absent CV: RRR, No M/G/R, cap refill <2 sec PULM: Breathing comfortably in no respiratory distress. no wheezing, crackles, rhonchi EXT: No c/c/e PSYCH: Friendly, good eye contact MSK: Nml gait    Assessment and Plan: 1. Tachycardia  EKG 12-Lead, EKG 12-Lead, Basic metabolic panel, CBC with Differential, TSH, Holter monitor - 48 hour  2.  URI (upper respiratory infection)     >40 minutes spent in face to face time with patient, >50% spent in counselling or coordination of care: I counseled her about her URI management. The vast majority of this office visit was spent in discussion regarding tachycardia. The patient does have a pulse of 125 on her EKG. It does appear to be sinus. She appeared to be acutely anxious in the office today, and I answered all of her questions to the best of my ability regarding potential causes for tachycardia. Also told her that she does need to have a further evaluation to ensure that this is a benign rhythm and she does not have a worrisome arrhythmia.  We will obtain a 48-hour Holter monitor to rule out a significant arrhythmia.  Obtain laboratories as above for potential causes as well.  EKG: Sinus rhythm. Pulse 125. Nonspecific T-wave changes with some mild flattening without any ST elevation or depression.

## 2011-03-30 ENCOUNTER — Telehealth: Payer: Self-pay | Admitting: Family Medicine

## 2011-03-30 ENCOUNTER — Encounter: Payer: Self-pay | Admitting: Family Medicine

## 2011-03-30 NOTE — Telephone Encounter (Signed)
Patient wanted to let you know that she saw Dr.Copland recently for increased pulse rate and blood pressure.  She feels it was from anxiety.  She wanted to let you know her blood pressure and pulse has been lower.  If it increases,she'll let you know.

## 2011-03-30 NOTE — Telephone Encounter (Signed)
Noted. Tachycardic to 125 on EKG and in office - recommended holter monitor to her.

## 2011-03-30 NOTE — Telephone Encounter (Signed)
Noted, will forward to Dr. Patsy Lager as a FYI.

## 2011-04-14 ENCOUNTER — Other Ambulatory Visit (INDEPENDENT_AMBULATORY_CARE_PROVIDER_SITE_OTHER): Payer: BC Managed Care – PPO

## 2011-04-14 DIAGNOSIS — R7309 Other abnormal glucose: Secondary | ICD-10-CM

## 2011-04-14 DIAGNOSIS — D509 Iron deficiency anemia, unspecified: Secondary | ICD-10-CM

## 2011-04-14 DIAGNOSIS — R739 Hyperglycemia, unspecified: Secondary | ICD-10-CM

## 2011-04-14 LAB — COMPREHENSIVE METABOLIC PANEL
ALT: 11 U/L (ref 0–35)
AST: 15 U/L (ref 0–37)
Alkaline Phosphatase: 66 U/L (ref 39–117)
Creatinine, Ser: 0.9 mg/dL (ref 0.4–1.2)
GFR: 70.07 mL/min (ref >60.00)
Total Bilirubin: 0.4 mg/dL (ref 0.3–1.2)

## 2011-04-14 LAB — CBC WITH DIFFERENTIAL/PLATELET
Basophils Absolute: 0 10*3/uL (ref 0.0–0.1)
HCT: 38.2 % (ref 36.0–46.0)
Hemoglobin: 12.7 g/dL (ref 12.0–15.0)
Lymphs Abs: 1.2 10*3/uL (ref 0.7–4.0)
MCHC: 33.2 g/dL (ref 30.0–36.0)
MCV: 80 fl (ref 78.0–100.0)
Monocytes Relative: 7.4 % (ref 3.0–12.0)
Neutro Abs: 4.1 10*3/uL (ref 1.4–7.7)
RDW: 14.4 % (ref 11.5–14.6)

## 2011-04-14 LAB — LIPID PANEL
HDL: 43.8 mg/dL (ref >39.00)
LDL Cholesterol: 106 mg/dL — ABNORMAL HIGH (ref 0–99)
Total CHOL/HDL Ratio: 4
Triglycerides: 92 mg/dL (ref 0.0–149.0)
VLDL: 18.4 mg/dL (ref 0.0–40.0)

## 2011-04-17 ENCOUNTER — Ambulatory Visit (INDEPENDENT_AMBULATORY_CARE_PROVIDER_SITE_OTHER): Payer: BC Managed Care – PPO | Admitting: Family Medicine

## 2011-04-17 ENCOUNTER — Encounter: Payer: Self-pay | Admitting: Family Medicine

## 2011-04-17 DIAGNOSIS — R739 Hyperglycemia, unspecified: Secondary | ICD-10-CM

## 2011-04-17 DIAGNOSIS — D509 Iron deficiency anemia, unspecified: Secondary | ICD-10-CM

## 2011-04-17 DIAGNOSIS — R7309 Other abnormal glucose: Secondary | ICD-10-CM

## 2011-04-17 DIAGNOSIS — I10 Essential (primary) hypertension: Secondary | ICD-10-CM

## 2011-04-17 DIAGNOSIS — E669 Obesity, unspecified: Secondary | ICD-10-CM

## 2011-04-17 DIAGNOSIS — E739 Lactose intolerance, unspecified: Secondary | ICD-10-CM

## 2011-04-17 DIAGNOSIS — F411 Generalized anxiety disorder: Secondary | ICD-10-CM

## 2011-04-17 DIAGNOSIS — K219 Gastro-esophageal reflux disease without esophagitis: Secondary | ICD-10-CM

## 2011-04-17 DIAGNOSIS — E611 Iron deficiency: Secondary | ICD-10-CM

## 2011-04-17 MED ORDER — OMEPRAZOLE 20 MG PO CPDR: 20.0000 mg | DELAYED_RELEASE_CAPSULE | Freq: Two times a day (BID) | ORAL | Status: DC | PRN

## 2011-04-17 MED ORDER — HYDROCHLOROTHIAZIDE 12.5 MG PO TABS: 12.5000 mg | ORAL_TABLET | Freq: Every day | ORAL | Status: DC

## 2011-04-17 MED ORDER — FERROUS SULFATE 325 (65 FE) MG PO TABS: 325.0000 mg | ORAL_TABLET | Freq: Every day | ORAL | Status: DC

## 2011-04-17 NOTE — Patient Instructions (Signed)
Come back for fasting labs in 3 months.   Keep working on your weight and avoiding sweets.  Start taking iron 325 mg a day (OTC). Let me know if your anxiety gets worse.  Keep using the breathing techniques.

## 2011-04-17 NOTE — Progress Notes (Signed)
Hypertension:    Using medication without problems or lightheadedness: yes Chest pain with exertion:no Edema:no Short of breath:no Average home BPs: 120-130/80s on home checks Other issues: palpitations resolved, likely related to anxiety.   H/o iron def anemia, prev worse with heavy periods.  No blood in stool, IFOB prev neg per patient at gyn office. FH of low iron (her grandmother).  She's off iron.  No FH colon CA.  CBC wnl.  Labs d/w pt.   Anxiety.  She has periodic episodes of anxiety.  No meds yet, but if this continues it will need to be addressed, likely with SSRI +/- BZD.  Okay for outpatient f/u now.   Hyperglycemia. D/w pt.  She is working on weight.  Will follow- Recheck in 3 months with A1c.    Meds, vitals, and allergies reviewed.   PMH and SH reviewed  ROS: See HPI.  Otherwise negative.    GEN: nad, alert and oriented HEENT: mucous membranes moist NECK: supple w/o LA CV: rrr. PULM: ctab, no inc wob ABD: soft, +bs EXT: no edema SKIN: no acute rash

## 2011-04-19 ENCOUNTER — Encounter: Payer: Self-pay | Admitting: Family Medicine

## 2011-04-19 DIAGNOSIS — E669 Obesity, unspecified: Secondary | ICD-10-CM | POA: Insufficient documentation

## 2011-04-19 NOTE — Assessment & Plan Note (Addendum)
Restart iron, per patient IFOB neg at gyn clinic. I don't suspect GI loss. Recheck iron later in 2013.

## 2011-04-19 NOTE — Assessment & Plan Note (Addendum)
Will follow symptomatically. D/w pt about behavioral methods to control sx, ie breathing exercises.

## 2011-04-19 NOTE — Assessment & Plan Note (Signed)
D/w pt about diet and weight loss.   

## 2011-04-19 NOTE — Assessment & Plan Note (Signed)
Controlled with PPI Will continue 

## 2011-04-19 NOTE — Assessment & Plan Note (Signed)
Continue work on [employer] and diet along with exercise.

## 2011-04-19 NOTE — Assessment & Plan Note (Signed)
Controlled on home checks, normal exam today.  No change in meds.

## 2011-04-23 ENCOUNTER — Other Ambulatory Visit: Payer: Self-pay | Admitting: Family Medicine

## 2011-04-23 NOTE — Telephone Encounter (Signed)
To: [facility] (Daytime Triage) Fax: [phone] From: [facility] Date/ Time: 04/23/2011 8:33 AM Taken By: Thomasena Edis, CSR Caller: Cordelia Pen Facility: not collected Patient: Sara Leonard, Sara Leonard DOB: 12/23/1960 Phone: [phone] Reason for Call: She said Dr Clelia Croft offered her a mild nerve pill to help her anxiety and she didnt want at first but since talking to him she has changed her mind. Please call to advise. Regarding Appointment: Appt Date: Appt Time: Unknown Provider: Reason: Details: Outcome:

## 2011-04-24 MED ORDER — PAROXETINE HCL 20 MG PO TABS: 20.0000 mg | ORAL_TABLET | ORAL | Status: DC

## 2011-04-24 NOTE — Telephone Encounter (Signed)
I would not start xanax or valium alone given her frequent sx.  I would start with paxil daily now.  It will take several weeks (up to 6 or longer) for the effect to be noted.  If mood worsens, then stop medicine and notify MD.  Schedule f/u with me in about 4 weeks, have her call back with update in about 2 weeks.

## 2011-04-24 NOTE — Telephone Encounter (Signed)
LMOVM to return call.

## 2011-04-27 NOTE — Telephone Encounter (Signed)
Patient advised.  Appt scheduled in 4 weeks.

## 2011-04-28 ENCOUNTER — Telehealth: Payer: Self-pay | Admitting: Family Medicine

## 2011-04-28 NOTE — Telephone Encounter (Signed)
Please notify pt.  Cut med in half and continue.  This should improve as she stays on the medicine.  Please adjust med list.

## 2011-04-28 NOTE — Telephone Encounter (Signed)
Patient advised.

## 2011-04-28 NOTE — Telephone Encounter (Signed)
Pt is calling about the Paroxetine HCL 20 mg. Started medication on Saturday and she is so nauseated she can't eat and is getting sick. She wants to know if she should stop the meds or cut it in half? shes not sure.

## 2011-05-21 ENCOUNTER — Telehealth: Payer: Self-pay | Admitting: Family Medicine

## 2011-05-21 NOTE — Telephone Encounter (Signed)
Patient notified as instructed by telephone. Appointment cancelled and rescheduled after lab work.

## 2011-05-21 NOTE — Telephone Encounter (Signed)
Cancel the upcoming f/u appointment, keep the lab visit in May.  Come in after labs in May to see me.  Thanks.

## 2011-05-21 NOTE — Telephone Encounter (Signed)
Patient has a 4 week follow up on 05/25/11.  She's not sure why she has this appointment.  Patient was given a rx for Paroxetine HCL for anxiety and patient stopped taking it because it made her sick.  Her anxiety has gotten better.  She said her blood pressure has been great.  She thought this appointment was for labwork,but her next lab appointment is in May.  Patient wants to know if her iron was suppose to be checked before May.  Does she need to keep the f/u appointment?

## 2011-05-25 ENCOUNTER — Ambulatory Visit: Payer: BC Managed Care – PPO | Admitting: Family Medicine

## 2011-07-14 ENCOUNTER — Other Ambulatory Visit (INDEPENDENT_AMBULATORY_CARE_PROVIDER_SITE_OTHER): Payer: BC Managed Care – PPO

## 2011-07-14 DIAGNOSIS — R7309 Other abnormal glucose: Secondary | ICD-10-CM

## 2011-07-14 DIAGNOSIS — R739 Hyperglycemia, unspecified: Secondary | ICD-10-CM

## 2011-07-14 DIAGNOSIS — E611 Iron deficiency: Secondary | ICD-10-CM

## 2011-07-14 LAB — IRON: Iron: 60 ug/dL (ref 42–145)

## 2011-07-14 LAB — GLUCOSE, RANDOM: Glucose, Bld: 108 mg/dL — ABNORMAL HIGH (ref 70–99)

## 2011-07-14 LAB — HEMOGLOBIN A1C: Hgb A1c MFr Bld: 6.5 % (ref 4.6–6.5)

## 2011-07-16 ENCOUNTER — Encounter: Payer: Self-pay | Admitting: Family Medicine

## 2011-07-16 ENCOUNTER — Ambulatory Visit (INDEPENDENT_AMBULATORY_CARE_PROVIDER_SITE_OTHER): Payer: BC Managed Care – PPO | Admitting: Family Medicine

## 2011-07-16 VITALS — BP 136/100 | HR 96 | Temp 98.1°F | Wt 168.8 lb

## 2011-07-16 DIAGNOSIS — I10 Essential (primary) hypertension: Secondary | ICD-10-CM

## 2011-07-16 DIAGNOSIS — D509 Iron deficiency anemia, unspecified: Secondary | ICD-10-CM

## 2011-07-16 DIAGNOSIS — E739 Lactose intolerance, unspecified: Secondary | ICD-10-CM

## 2011-07-16 NOTE — Patient Instructions (Addendum)
I would keep taking the iron.   If you get dizzy on standing, stop the HCTZ and monitor your BP. If you need to restart it, go ahead.  Keep exercising and working on your diet.   Recheck labs in 6 months before a visit.

## 2011-07-16 NOTE — Progress Notes (Signed)
H/o iron def anemia, FH of same.  No blood in stool at the gyn office.  No FH of colon cancer.  Started iron replacement and iron level is improved.  She doesn't eat much red meat.   Hyperglycemia.  Lost 17 labs on her home scales.  Walking 7 days a week. She feels really good about the exercise and changes.  Mood is good.  Working on diet.  Labs d/w pt.  Sugar is improved.   Hypertension:    Using medication without problems or lightheadedness: yes Chest pain with exertion:no Edema:no Short of breath:no Average home BPs:  Usually 110s/70s at home We talked about coming off the medicine.    Meds, vitals, and allergies reviewed.   ROS: See HPI.  Otherwise negative.    GEN: nad, alert and oriented HEENT: mucous membranes moist NECK: supple w/o LA CV: rrr. PULM: ctab, no inc wob ABD: soft, +bs EXT: no edema SKIN: no acute rash

## 2011-07-17 NOTE — Assessment & Plan Note (Signed)
Improved, continue diet/exercise and recheck in 6 months.

## 2011-07-17 NOTE — Assessment & Plan Note (Signed)
Continue meds for now, stop if orthostatic.

## 2011-07-17 NOTE — Assessment & Plan Note (Signed)
Continue iron for now.  She agrees.

## 2011-12-21 ENCOUNTER — Other Ambulatory Visit: Payer: Self-pay

## 2012-01-18 ENCOUNTER — Other Ambulatory Visit: Payer: BC Managed Care – PPO

## 2012-01-20 ENCOUNTER — Ambulatory Visit: Payer: BC Managed Care – PPO | Admitting: Family Medicine

## 2012-01-22 ENCOUNTER — Other Ambulatory Visit (INDEPENDENT_AMBULATORY_CARE_PROVIDER_SITE_OTHER): Payer: BC Managed Care – PPO

## 2012-01-22 ENCOUNTER — Other Ambulatory Visit: Payer: Self-pay | Admitting: Family Medicine

## 2012-01-22 DIAGNOSIS — E611 Iron deficiency: Secondary | ICD-10-CM

## 2012-01-22 DIAGNOSIS — I10 Essential (primary) hypertension: Secondary | ICD-10-CM

## 2012-01-22 DIAGNOSIS — D509 Iron deficiency anemia, unspecified: Secondary | ICD-10-CM

## 2012-01-22 LAB — IRON: Iron: 71 ug/dL (ref 42–145)

## 2012-01-22 LAB — BASIC METABOLIC PANEL
CO2: 27 mEq/L (ref 19–32)
Calcium: 9.3 mg/dL (ref 8.4–10.5)
Creatinine, Ser: 0.8 mg/dL (ref 0.4–1.2)
GFR: 83.64 mL/min (ref >60.00)
Sodium: 139 mEq/L (ref 135–145)

## 2012-01-28 ENCOUNTER — Telehealth: Payer: Self-pay | Admitting: Family Medicine

## 2012-01-28 NOTE — Telephone Encounter (Signed)
Called pt.  I am currently sick and will need to cancel clinic tomorrow.  I called pt to reschedule.  Pt agreed.

## 2012-01-29 ENCOUNTER — Ambulatory Visit: Payer: BC Managed Care – PPO | Admitting: Family Medicine

## 2012-02-01 ENCOUNTER — Encounter: Payer: Self-pay | Admitting: Family Medicine

## 2012-02-01 ENCOUNTER — Ambulatory Visit (INDEPENDENT_AMBULATORY_CARE_PROVIDER_SITE_OTHER): Payer: BC Managed Care – PPO | Admitting: Family Medicine

## 2012-02-01 VITALS — BP 134/96 | HR 108 | Temp 98.2°F | Wt 162.0 lb

## 2012-02-01 DIAGNOSIS — R7309 Other abnormal glucose: Secondary | ICD-10-CM

## 2012-02-01 DIAGNOSIS — D509 Iron deficiency anemia, unspecified: Secondary | ICD-10-CM

## 2012-02-01 DIAGNOSIS — E739 Lactose intolerance, unspecified: Secondary | ICD-10-CM

## 2012-02-01 DIAGNOSIS — I10 Essential (primary) hypertension: Secondary | ICD-10-CM

## 2012-02-01 DIAGNOSIS — R739 Hyperglycemia, unspecified: Secondary | ICD-10-CM

## 2012-02-01 LAB — HEMOGLOBIN A1C: Hgb A1c MFr Bld: 5.7 % (ref 4.6–6.5)

## 2012-02-01 NOTE — Patient Instructions (Addendum)
Keep taking the iron.  Schedule a physical in 6 months.   Take care.  Keep working your sugar.   Glad to see you.

## 2012-02-01 NOTE — Progress Notes (Signed)
Hyperglycemia.  Labs d/w pt.  She's still losing weight, intentionally.  She's still walking.  She had eaten a lot of carbs the night before the day of labs.  Yesterday her sugar was 100.  She is trying to cut out carbs; discussed.  She is measuring proportions.  She needs a meter.  She feels well. A1c pending.   H/o HTN.  Is off HCTZ.  She had checked her BP at home, much lower- 109/68, pulse 75 at home. SBP always 120 or lower.    Has had h/o iron def anemia since teenage years.  No FH colon cancer.  Stool was neg for blood at gyn clinic this year.  I've asked pt to consider colonoscopy as a screening and she'll consider it.  She isn't constipated from the iron, usually tolerated well.    PMH and SH reviewed  ROS: See HPI, otherwise noncontributory.  Meds, vitals, and allergies reviewed.   GEN: nad, alert and oriented HEENT: mucous membranes moist NECK: supple w/o LA CV: rrr. PULM: ctab, no inc wob ABD: soft, +bs EXT: no edema SKIN: no acute rash

## 2012-02-02 ENCOUNTER — Encounter: Payer: Self-pay | Admitting: Family Medicine

## 2012-02-02 NOTE — Assessment & Plan Note (Signed)
Longstanding, prev heme neg.  She'll consider colonoscopy.  Referral offered.  Continue iron.

## 2012-02-02 NOTE — Assessment & Plan Note (Signed)
Improved on outside checks, now resolved with weight loss/exercise.

## 2012-02-02 NOTE — Assessment & Plan Note (Signed)
With A1c much improved.  She'll check sugar episodically at home.  Written rx given for meter and lancets/strips.  She agrees.  Continue work on diet and exercise.

## 2012-03-13 IMAGING — CR DG KNEE 1-2V*L*
2 series · 2 of 2 positions shown · non-contrast
Comparison: 10/13/2010

CLINICAL DATA: Left knee pain

LEFT KNEE - 1-2 VIEW

[view not recorded (1 of 2)]
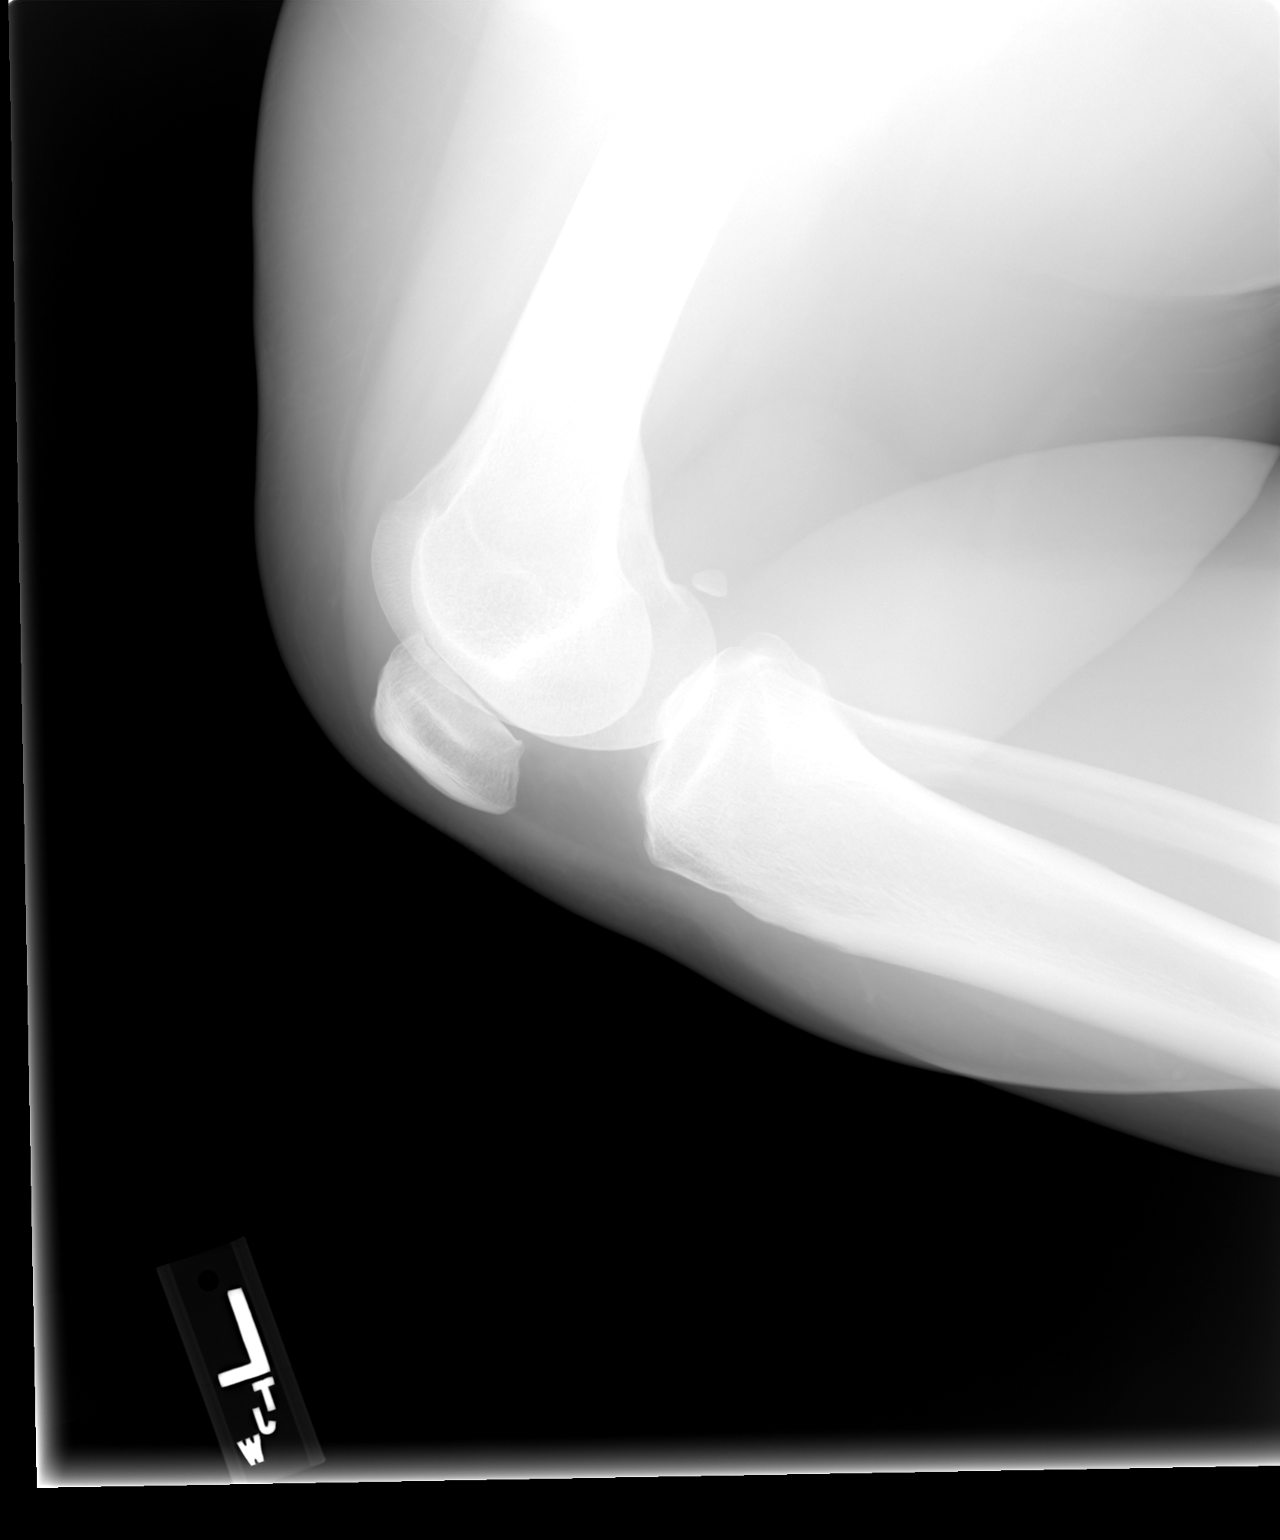

[view not recorded (2 of 2)]
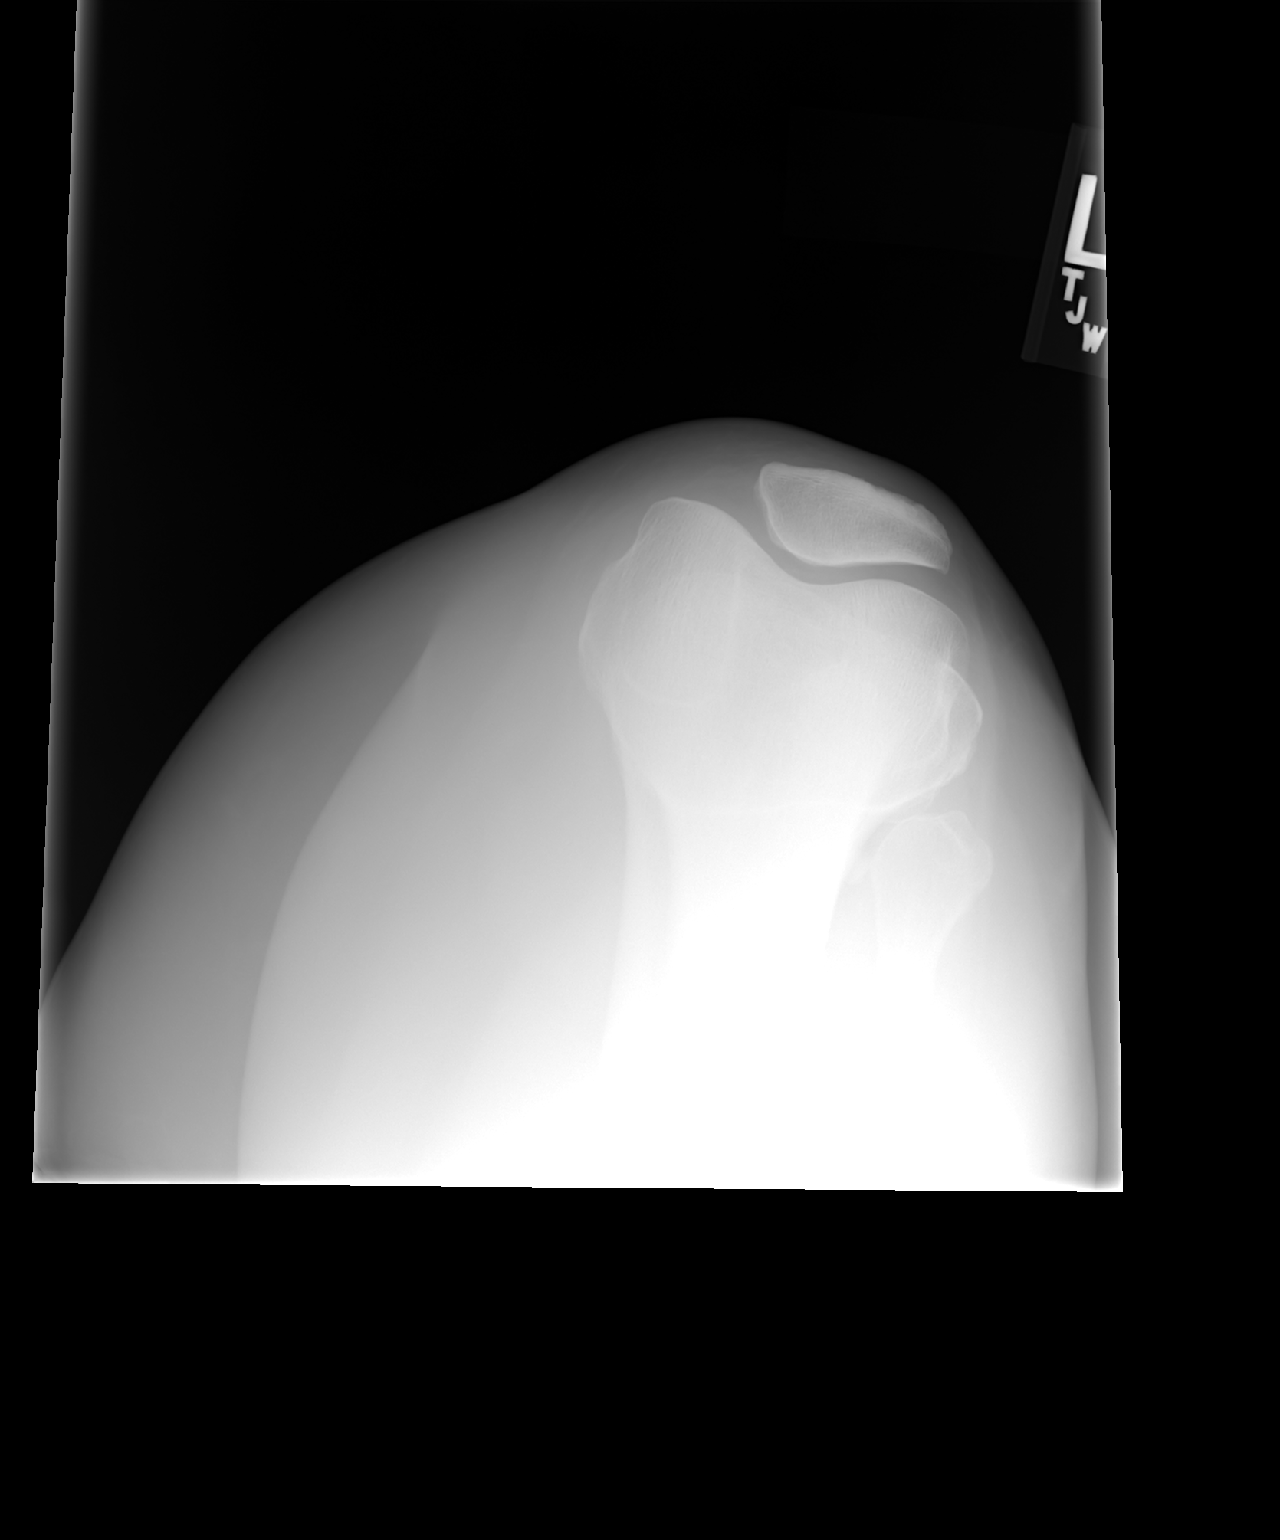

[2 of 2 positions shown; findings below may reference images not displayed]

FINDINGS: Negative for fracture.  Normal alignment.  Sunrise view
of the patella is normal without significant spurring.  Fabella is
noted.
IMPRESSION: Negative

## 2012-06-08 ENCOUNTER — Other Ambulatory Visit: Payer: Self-pay | Admitting: *Deleted

## 2012-06-08 MED ORDER — OMEPRAZOLE 20 MG PO CPDR: 20.0000 mg | DELAYED_RELEASE_CAPSULE | Freq: Two times a day (BID) | ORAL | Status: DC | PRN

## 2012-07-10 ENCOUNTER — Other Ambulatory Visit: Payer: Self-pay | Admitting: Family Medicine

## 2012-07-10 DIAGNOSIS — R739 Hyperglycemia, unspecified: Secondary | ICD-10-CM

## 2012-07-10 DIAGNOSIS — D509 Iron deficiency anemia, unspecified: Secondary | ICD-10-CM

## 2012-07-26 ENCOUNTER — Encounter: Payer: Self-pay | Admitting: Family Medicine

## 2012-07-26 ENCOUNTER — Other Ambulatory Visit (INDEPENDENT_AMBULATORY_CARE_PROVIDER_SITE_OTHER): Payer: BC Managed Care – PPO

## 2012-07-26 DIAGNOSIS — R739 Hyperglycemia, unspecified: Secondary | ICD-10-CM

## 2012-07-26 DIAGNOSIS — R7309 Other abnormal glucose: Secondary | ICD-10-CM

## 2012-07-26 DIAGNOSIS — D509 Iron deficiency anemia, unspecified: Secondary | ICD-10-CM

## 2012-07-26 LAB — COMPREHENSIVE METABOLIC PANEL
ALT: 19 U/L (ref 0–35)
Alkaline Phosphatase: 64 U/L (ref 39–117)
Glucose, Bld: 114 mg/dL — ABNORMAL HIGH (ref 70–99)
Sodium: 139 mEq/L (ref 135–145)
Total Bilirubin: 0.4 mg/dL (ref 0.3–1.2)
Total Protein: 7.1 g/dL (ref 6.0–8.3)

## 2012-07-26 LAB — LIPID PANEL
Cholesterol: 192 mg/dL (ref 0–200)
LDL Cholesterol: 137 mg/dL — ABNORMAL HIGH (ref 0–99)
VLDL: 8.6 mg/dL (ref 0.0–40.0)

## 2012-07-26 LAB — IRON: Iron: 60 ug/dL (ref 42–145)

## 2012-08-01 ENCOUNTER — Ambulatory Visit (INDEPENDENT_AMBULATORY_CARE_PROVIDER_SITE_OTHER): Payer: BC Managed Care – PPO | Admitting: Family Medicine

## 2012-08-01 ENCOUNTER — Encounter: Payer: Self-pay | Admitting: Family Medicine

## 2012-08-01 VITALS — BP 152/86 | HR 95 | Temp 98.0°F | Wt 159.2 lb

## 2012-08-01 DIAGNOSIS — Z1211 Encounter for screening for malignant neoplasm of colon: Secondary | ICD-10-CM

## 2012-08-01 DIAGNOSIS — F411 Generalized anxiety disorder: Secondary | ICD-10-CM

## 2012-08-01 DIAGNOSIS — Z0000 Encounter for general adult medical examination without abnormal findings: Secondary | ICD-10-CM | POA: Insufficient documentation

## 2012-08-01 DIAGNOSIS — D509 Iron deficiency anemia, unspecified: Secondary | ICD-10-CM

## 2012-08-01 DIAGNOSIS — K219 Gastro-esophageal reflux disease without esophagitis: Secondary | ICD-10-CM

## 2012-08-01 DIAGNOSIS — E739 Lactose intolerance, unspecified: Secondary | ICD-10-CM

## 2012-08-01 MED ORDER — OMEPRAZOLE 20 MG PO CPDR: 20.0000 mg | DELAYED_RELEASE_CAPSULE | Freq: Every day | ORAL | Status: DC

## 2012-08-01 NOTE — Assessment & Plan Note (Signed)
Normalized.  Continue iron.

## 2012-08-01 NOTE — Assessment & Plan Note (Signed)
Routine anticipatory guidance given to patient.  See health maintenance. BP has been normal at home. Cuff was calibrated today.   Tetanus 2006 Flu shot not indicated due to prev reaction.   Mammogram and breast exam up to date, not due until 10/14. Per gyn Pap up to date.  Per gyn.  D/w patient [id number] for colon cancer screening, including IFOB vs. colonoscopy.  Risks and benefits of both were discussed and patient voiced understanding.  Pt elects for: IFOB.   Living will. D/w pt. Would have her husband designated.   Sugar is improved, not fully normalized.  She is still working on weight and diet.  Discussed.

## 2012-08-01 NOTE — Assessment & Plan Note (Signed)
Improved, continue diet and exercise.  

## 2012-08-01 NOTE — Assessment & Plan Note (Signed)
Treated with exercise.  Doing well overall.

## 2012-08-01 NOTE — Patient Instructions (Addendum)
Go to the lab on the way out.  We'll contact you with your lab report. Thank you for your effort.  Take care.

## 2012-08-01 NOTE — Progress Notes (Signed)
CPE- See plan.  Routine anticipatory guidance given to patient.  See health maintenance. BP has been normal at home. Cuff was calibrated today.   Tetanus 2006 Flu shot not indicated due to prev reaction.   Mammogram and breast exam up to date, not due until 10/14. Per gyn Pap up to date.  Per gyn.  D/w patient [id number] for colon cancer screening, including IFOB vs. colonoscopy.  Risks and benefits of both were discussed and patient voiced understanding.  Pt elects for: IFOB.   Living will. D/w pt. Would have her husband designated.   Sugar is improved, not fully normalized.  She is still working on weight and diet.  Discussed.    She is worried about her son (he has OCD) and this is a Biochemist, clinical" source of stress.  She is walking daily for stress relief.    She continues iron replacement for h/o anemia. Labs are normalized.  She is tolerating iron.  GERD controlled with current meds.   PMH and SH reviewed  Meds, vitals, and allergies reviewed.   ROS: See HPI.  Otherwise negative.    GEN: nad, alert and oriented HEENT: mucous membranes moist NECK: supple w/o LA CV: rrr. PULM: ctab, no inc wob ABD: soft, +bs EXT: no edema SKIN: no acute rash

## 2012-08-01 NOTE — Assessment & Plan Note (Signed)
Controlled, continue as is.  She can get by with one pill a day on some days, so that is improved from prev.

## 2012-08-05 ENCOUNTER — Other Ambulatory Visit (INDEPENDENT_AMBULATORY_CARE_PROVIDER_SITE_OTHER): Payer: BC Managed Care – PPO

## 2012-08-05 DIAGNOSIS — Z1211 Encounter for screening for malignant neoplasm of colon: Secondary | ICD-10-CM

## 2012-08-08 ENCOUNTER — Encounter: Payer: Self-pay | Admitting: *Deleted

## 2012-12-22 ENCOUNTER — Telehealth: Payer: Self-pay | Admitting: *Deleted

## 2012-12-22 NOTE — Telephone Encounter (Signed)
Thanks

## 2012-12-22 NOTE — Telephone Encounter (Signed)
FYI Prior auth for omeprazole was requested by pharmacy. I completed the forms using information provided in patients medical record. Med was approved from 11/28/12 to 12/21/12. I faxed approval notice to pharmacy, and will send prior auth paperwork to be scanned.

## 2013-08-25 ENCOUNTER — Other Ambulatory Visit: Payer: Self-pay | Admitting: Family Medicine

## 2013-08-25 MED ORDER — OMEPRAZOLE 20 MG PO CPDR
20.0000 mg | DELAYED_RELEASE_CAPSULE | Freq: Every day | ORAL | Status: DC
Start: 2013-08-25 — End: 2014-05-07

## 2014-01-30 LAB — HM MAMMOGRAPHY

## 2014-05-07 ENCOUNTER — Other Ambulatory Visit: Payer: Self-pay | Admitting: *Deleted

## 2014-05-07 MED ORDER — OMEPRAZOLE 20 MG PO CPDR: 20.0000 mg | DELAYED_RELEASE_CAPSULE | Freq: Every day | ORAL | Status: DC

## 2014-05-13 ENCOUNTER — Other Ambulatory Visit: Payer: Self-pay | Admitting: Family Medicine

## 2014-05-13 DIAGNOSIS — E7800 Pure hypercholesterolemia, unspecified: Secondary | ICD-10-CM

## 2014-05-14 ENCOUNTER — Other Ambulatory Visit (INDEPENDENT_AMBULATORY_CARE_PROVIDER_SITE_OTHER): Payer: BC Managed Care – PPO

## 2014-05-14 DIAGNOSIS — E7800 Pure hypercholesterolemia, unspecified: Secondary | ICD-10-CM

## 2014-05-14 DIAGNOSIS — D509 Iron deficiency anemia, unspecified: Secondary | ICD-10-CM

## 2014-05-14 DIAGNOSIS — F411 Generalized anxiety disorder: Secondary | ICD-10-CM

## 2014-05-14 LAB — COMPREHENSIVE METABOLIC PANEL
ALBUMIN: 4.3 g/dL (ref 3.5–5.2)
ALT: 18 U/L (ref 0–35)
AST: 16 U/L (ref 0–37)
Alkaline Phosphatase: 82 U/L (ref 39–117)
BILIRUBIN TOTAL: 0.5 mg/dL (ref 0.2–1.2)
BUN: 11 mg/dL (ref 6–23)
CHLORIDE: 106 meq/L (ref 96–112)
CO2: 29 meq/L (ref 19–32)
Calcium: 10.1 mg/dL (ref 8.4–10.5)
Creatinine, Ser: 0.79 mg/dL (ref 0.40–1.20)
GFR: 80.49 mL/min (ref >60.00)
Glucose, Bld: 128 mg/dL — ABNORMAL HIGH (ref 70–99)
POTASSIUM: 4.2 meq/L (ref 3.5–5.1)
SODIUM: 141 meq/L (ref 135–145)
TOTAL PROTEIN: 7.1 g/dL (ref 6.0–8.3)

## 2014-05-14 LAB — LIPID PANEL
CHOL/HDL RATIO: 4
Cholesterol: 181 mg/dL (ref 0–200)
HDL: 47.3 mg/dL (ref >39.00)
LDL CALC: 124 mg/dL — AB (ref 0–99)
NonHDL: 133.7
Triglycerides: 51 mg/dL (ref 0.0–149.0)
VLDL: 10.2 mg/dL (ref 0.0–40.0)

## 2014-05-14 LAB — TSH: TSH: 2.02 u[IU]/mL (ref 0.35–4.50)

## 2014-05-14 LAB — VITAMIN B12: Vitamin B-12: 411 pg/mL (ref 211–911)

## 2014-05-14 LAB — VITAMIN D 25 HYDROXY (VIT D DEFICIENCY, FRACTURES): VITD: 25.43 ng/mL — AB (ref 30.00–100.00)

## 2014-05-21 ENCOUNTER — Telehealth: Payer: Self-pay

## 2014-05-21 ENCOUNTER — Ambulatory Visit (INDEPENDENT_AMBULATORY_CARE_PROVIDER_SITE_OTHER): Payer: BC Managed Care – PPO | Admitting: Family Medicine

## 2014-05-21 ENCOUNTER — Encounter: Payer: Self-pay | Admitting: Family Medicine

## 2014-05-21 VITALS — BP 144/90 | HR 93 | Temp 98.6°F | Ht 60.0 in | Wt 170.5 lb

## 2014-05-21 DIAGNOSIS — Z1211 Encounter for screening for malignant neoplasm of colon: Secondary | ICD-10-CM

## 2014-05-21 DIAGNOSIS — K219 Gastro-esophageal reflux disease without esophagitis: Secondary | ICD-10-CM

## 2014-05-21 DIAGNOSIS — E559 Vitamin D deficiency, unspecified: Secondary | ICD-10-CM

## 2014-05-21 DIAGNOSIS — Z7189 Other specified counseling: Secondary | ICD-10-CM

## 2014-05-21 DIAGNOSIS — Z0000 Encounter for general adult medical examination without abnormal findings: Secondary | ICD-10-CM

## 2014-05-21 DIAGNOSIS — R739 Hyperglycemia, unspecified: Secondary | ICD-10-CM

## 2014-05-21 MED ORDER — OMEPRAZOLE 20 MG PO CPDR
20.0000 mg | DELAYED_RELEASE_CAPSULE | Freq: Every day | ORAL | Status: DC
Start: 2014-05-21 — End: 2015-06-20

## 2014-05-21 NOTE — Telephone Encounter (Signed)
I didn't need the cbc or the iron this time.   With her checking her sugar at home, I was more interested in that than her A1c.   Neither set of labs was likely to change management, so I didn't get them.   Thanks.

## 2014-05-21 NOTE — Telephone Encounter (Signed)
Pt left v/m; pt was seen earlier today and when pt was comparing 05/14/14 labs to previous lab results and noticed cbc and A1C was not checked this time. Pt wanted to know if there was a reason why these test were not done. Pt request cb.

## 2014-05-21 NOTE — Progress Notes (Signed)
Pre visit review using our clinic review tool, if applicable. No additional management support is needed unless otherwise documented below in the visit note.  CPE- See plan.  Routine anticipatory guidance given to patient.  See health maintenance. BP has been normal at home.  H/o white coat htn.   Tetanus 2006- she describes sig local injection site reaction.  She doesn't have high risk exposures (ie young kids for tdap) PNA and shingles not due.  Flu shot not indicated due to prev reaction.  Mammogram and breast exam, per gyn.  Mammogram done in 01/2014 per patient report.   Pap up to date. Per gyn.  D/w patient [id number]:options for colon cancer screening, including IFOB vs. colonoscopy. Risks and benefits of both were discussed and patient voiced understanding. Pt elects for: IFOB.  Living will. D/w pt. Would have her husband designated.  Diet and exercise d/w pt.   She is still working on weight and diet. Discussed. she had been checking her sugar recently, usually >100 but less than 125.  She has checked mult times, pre/post meals.  Likely hyperglycemia, likely not DM2.  D/w pt about low carb diet.  She is getting back into exercise after her knee pain flared up and then got some better in the meantime.   Low vit D on labs.  D/w pt.   GERD on PPI.  Doing well.  Needs refill.  D/w pt about PPI use, she can try to taper if tolerated.   PMH and SH reviewed  Meds, vitals, and allergies reviewed.   ROS: See HPI.  Otherwise negative.    GEN: nad, alert and oriented HEENT: mucous membranes moist NECK: supple w/o LA CV: rrr. PULM: ctab, no inc wob ABD: soft, +bs EXT: no edema SKIN: no acute rash

## 2014-05-21 NOTE — Telephone Encounter (Signed)
Patient advised.

## 2014-05-21 NOTE — Patient Instructions (Signed)
Go to the lab on the way out.  We'll contact you with your lab report (stool cards). Try to taper of the prilosec if possible as your weight come down with diet and exercise.  Add on 1000 units of vitamin D a day.  Take care.  Glad to see you.

## 2014-05-22 DIAGNOSIS — R739 Hyperglycemia, unspecified: Secondary | ICD-10-CM | POA: Insufficient documentation

## 2014-05-22 DIAGNOSIS — Z7189 Other specified counseling: Secondary | ICD-10-CM | POA: Insufficient documentation

## 2014-05-22 DIAGNOSIS — E119 Type 2 diabetes mellitus without complications: Secondary | ICD-10-CM | POA: Insufficient documentation

## 2014-05-22 DIAGNOSIS — E559 Vitamin D deficiency, unspecified: Secondary | ICD-10-CM | POA: Insufficient documentation

## 2014-05-22 DIAGNOSIS — Z8639 Personal history of other endocrine, nutritional and metabolic disease: Secondary | ICD-10-CM | POA: Insufficient documentation

## 2014-05-22 NOTE — Assessment & Plan Note (Signed)
Okay to continue PPI, with taper if possible.  She is avoiding trigger foods.  No ADE on med.

## 2014-05-22 NOTE — Assessment & Plan Note (Signed)
Routine anticipatory guidance given to patient.  See health maintenance. BP has been normal at home.  H/o white coat htn.   Tetanus 2006- she describes sig local injection site reaction.  She doesn't have high risk exposures (ie young kids for tdap) PNA and shingles not due.  Flu shot not indicated due to prev reaction.  Mammogram and breast exam, per gyn.  Mammogram done in 01/2014 per patient report.   Pap up to date. Per gyn.  D/w patient [id number]:options for colon cancer screening, including IFOB vs. colonoscopy. Risks and benefits of both were discussed and patient voiced understanding. Pt elects for: IFOB.  Living will. D/w pt. Would have her husband designated.  Diet and exercise d/w pt.   She is still working on weight and diet. Discussed. she had been checking her sugar recently, usually >100 but less than 125.  She has checked mult times, pre/post meals.  Likely hyperglycemia, likely not DM2.  D/w pt about low carb diet.  She is getting back into exercise after her knee pain flared up and then got some better in the meantime.   Low vit D on labs.  D/w pt.

## 2014-05-22 NOTE — Assessment & Plan Note (Signed)
Add on 1000 units of vitamin D a day.  We can recheck periodically.  D/w pt.

## 2014-05-22 NOTE — Assessment & Plan Note (Signed)
Diet and exercise d/w pt.   She is still working on weight and diet. Discussed. she had been checking her sugar recently, usually >100 but less than 125.  She has checked mult times, pre/post meals.  Likely hyperglycemia, likely not DM2.  D/w pt about low carb diet.  She is getting back into exercise after her knee pain flared up and then got some better in the meantime.

## 2014-05-29 ENCOUNTER — Other Ambulatory Visit (INDEPENDENT_AMBULATORY_CARE_PROVIDER_SITE_OTHER): Payer: BC Managed Care – PPO

## 2014-05-29 DIAGNOSIS — Z1211 Encounter for screening for malignant neoplasm of colon: Secondary | ICD-10-CM | POA: Diagnosis not present

## 2014-05-30 LAB — FECAL OCCULT BLOOD, IMMUNOCHEMICAL: Fecal Occult Bld: NEGATIVE

## 2014-08-21 ENCOUNTER — Encounter: Payer: Self-pay | Admitting: Family Medicine

## 2014-08-21 ENCOUNTER — Ambulatory Visit (INDEPENDENT_AMBULATORY_CARE_PROVIDER_SITE_OTHER): Payer: BC Managed Care – PPO | Admitting: Family Medicine

## 2014-08-21 VITALS — BP 142/90 | HR 101 | Temp 98.9°F | Wt 168.2 lb

## 2014-08-21 DIAGNOSIS — R197 Diarrhea, unspecified: Secondary | ICD-10-CM

## 2014-08-21 NOTE — Progress Notes (Signed)
Pre visit review using our clinic review tool, if applicable. No additional management support is needed unless otherwise documented below in the visit note.  Her son had a fever and diarrhea (up to 18x in a day initially).  He gradually got better.  In the meantime, she has started to have diarrhea (started about 4 days ago).  Preceded by abd cramping and gurgling. She doesn't feel poorly overall.  Sx continued in the meantime.  She took imodium this AM and did better today.  No abd pain now.  No known fevers.  No mucous in stool.  No nausea now- had some initially.  No blood in stool.    Meds, vitals, and allergies reviewed.   ROS: See HPI.  Otherwise, noncontributory.  GEN: nad, alert and oriented HEENT: mucous membranes moist CV: rrr PULM: ctab, no inc wob ABD: soft, +bs EXT: no edema

## 2014-08-21 NOTE — Patient Instructions (Signed)
Stop dairy in the meantime.   This should gradually get better.  Imodium once a day if needed for a few days.   Likely viral GI illness.  Should resolve by itself.

## 2014-08-22 ENCOUNTER — Telehealth: Payer: Self-pay | Admitting: *Deleted

## 2014-08-22 DIAGNOSIS — R197 Diarrhea, unspecified: Secondary | ICD-10-CM | POA: Insufficient documentation

## 2014-08-22 NOTE — Telephone Encounter (Signed)
Prior Sara Leonard showed that it was received today---PA submitted through [email or web address]

## 2014-08-22 NOTE — Telephone Encounter (Signed)
Pt left v/m returning call and request cb at [phone].

## 2014-08-22 NOTE — Telephone Encounter (Signed)
Patient calling to check on status of prior authorization for omeprazole.  She is going out of town and would like this addressed as soon as we are able to.  Has PA been initiated?

## 2014-08-22 NOTE — Telephone Encounter (Signed)
Patient notified by telephone that PA has been submitted. Advised patient that we have no way of knowing how long it will take for the insurance to deny it or approve it. Advised patient that the pharmacy and she will be notified as soon as we hear back about the PA.

## 2014-08-22 NOTE — Assessment & Plan Note (Signed)
Likely self resolving viral process, with a known sick exposure at home.  Avoid dairy, f/u prn.  Can use imodium if needed, prn.  She agrees, reassured.  Okay for outpatient f/u.

## 2014-08-22 NOTE — Telephone Encounter (Signed)
Left message on voice mail  to call back

## 2014-08-24 NOTE — Telephone Encounter (Signed)
Rechecked with CMM.  Additional questions were being asked.  Submitted again, awaiting response.

## 2014-08-27 NOTE — Telephone Encounter (Addendum)
Omeprazole has been approved. Patient and pharmacy notified.

## 2014-10-26 ENCOUNTER — Telehealth: Payer: Self-pay

## 2014-10-26 NOTE — Telephone Encounter (Signed)
Pt left v/m requesting refill test strips; left v/m requesting pt to cb.

## 2014-10-29 MED ORDER — GLUCOSE BLOOD VI STRP: ORAL_STRIP | Status: DC

## 2014-10-29 NOTE — Telephone Encounter (Signed)
Pt request refill onetouch verio test strip to [facility]; refilled per protocol and pt notified done.

## 2015-02-11 ENCOUNTER — Ambulatory Visit (INDEPENDENT_AMBULATORY_CARE_PROVIDER_SITE_OTHER): Payer: BC Managed Care – PPO | Admitting: Family Medicine

## 2015-02-11 ENCOUNTER — Encounter: Payer: Self-pay | Admitting: Family Medicine

## 2015-02-11 VITALS — BP 140/86 | HR 100 | Temp 99.6°F | Wt 164.5 lb

## 2015-02-11 DIAGNOSIS — B349 Viral infection, unspecified: Secondary | ICD-10-CM

## 2015-02-11 MED ORDER — ONDANSETRON HCL 4 MG PO TABS: 4.0000 mg | ORAL_TABLET | Freq: Three times a day (TID) | ORAL | Status: DC | PRN

## 2015-02-11 NOTE — Patient Instructions (Signed)
Rest and sips of fluids.  Wash your hands.   Zofran as needed for nausea.  Take care.  Glad to see you.  The muscle pain should gradually resolve.

## 2015-02-11 NOTE — Progress Notes (Signed)
Pre visit review using our clinic review tool, if applicable. No additional management support is needed unless otherwise documented below in the visit note.  ST started about 1 week ago.  Then had a cough for about 4 nights.  Was gargling with salt water and taking advil. In meantime the cough improved.  Then started vomiting. Mult times in the last 2 days but not yet today.  Was able to hold down some pudding today.  Abd wall pain likely from coughing and vomiting, B midaxillary lines.  Not much pain if sitting still but pain if moving, twisting, stretching. No blood in vomit.  No diarrhea.  Dec in appetite overall.  Still taking fluids.  Urine is clear.   No known fevers, no temps >100.  No rash.    Meds, vitals, and allergies reviewed.   ROS: See HPI.  Otherwise, noncontributory.  nad ncat Mmm, OP wnl Neck supple, no LA rrr ctab abd soft, not ttp in RUQ but B abd wall ttp near the midaxillary lines.   No rash Ext w/o edema.

## 2015-02-12 ENCOUNTER — Telehealth: Payer: Self-pay | Admitting: Family Medicine

## 2015-02-12 ENCOUNTER — Encounter: Payer: Self-pay | Admitting: Family Medicine

## 2015-02-12 ENCOUNTER — Ambulatory Visit (INDEPENDENT_AMBULATORY_CARE_PROVIDER_SITE_OTHER): Payer: BC Managed Care – PPO | Admitting: Family Medicine

## 2015-02-12 VITALS — BP 104/64 | HR 104 | Temp 98.1°F | Wt 163.2 lb

## 2015-02-12 DIAGNOSIS — B09 Unspecified viral infection characterized by skin and mucous membrane lesions: Secondary | ICD-10-CM

## 2015-02-12 DIAGNOSIS — R160 Hepatomegaly, not elsewhere classified: Secondary | ICD-10-CM | POA: Insufficient documentation

## 2015-02-12 NOTE — Progress Notes (Signed)
Pre visit review using our clinic review tool, if applicable. No additional management support is needed unless otherwise documented below in the visit note. 

## 2015-02-12 NOTE — Patient Instructions (Addendum)
Let's check ultrasound for gallstones. I think this rash was viral. Should improve with time.  Pass by our referral coordinators to schedule ultrasound. Good to see you today, call us with questions.

## 2015-02-12 NOTE — Assessment & Plan Note (Addendum)
Agree with PCP recent back and side soreness due to muscle aches from coughing fits. Abd soreness likely from recent vomiting which has now resolved. Hepatomegaly on exam - in h/o known gallstones and new mild RUQ discomfort on palpation, check abd US and if abnormal return for further labwork.

## 2015-02-12 NOTE — Telephone Encounter (Signed)
Seen today. 

## 2015-02-12 NOTE — Telephone Encounter (Signed)
Pt has appt 02/12/15 at 11:15 with Dr Reece AgarG.

## 2015-02-12 NOTE — Assessment & Plan Note (Signed)
With recent viral URI, anticipate benign viral exanthem - reassured. Should improve with time.

## 2015-02-12 NOTE — Progress Notes (Signed)
BP 104/64 mmHg  Pulse 104  Temp(Src) 98.1 F (36.7 C) (Oral)  Wt 163 lb 4 oz (74.05 kg)  LMP 05/10/2011   CC: rash   Subjective:    Patient ID: Sara Leonard, female    DOB: May 04, 1960, 54 y.o.   MRN: [id number]  HPI: Sara Leonard is a 54 y.o. female presenting on 02/12/2015 for Rash   Seen here yesterday by PCP with cold sxs, cough and body aches x1 wk with vomiting over weekend. Thought viral. Still mild persistent congestion but overall improving. Some PNDrainage. Cough is much better.   This morning woke up with diffuse rash. Itchy. No new medicines, soaps, shampoos, detergents, lotions or foods.   She did have low grade temperature yesterday to 99.6 max.  Hair dresser - around sick contacts.  No smokers at home. No h/o asthma.  Known gallstones - now with some RUQ discomfort. No alcohol, no tylenol use.   Relevant past medical, surgical, family and social history reviewed and updated as indicated. Interim medical history since our last visit reviewed. Allergies and medications reviewed and updated. Current Outpatient Prescriptions on File Prior to Visit  Medication Sig  . glucose blood (ONETOUCH VERIO) test strip Check blood sugar once daily and as directed. R73.9  . omeprazole (PRILOSEC) 20 MG capsule Take 1-2 capsules (20-40 mg total) by mouth daily.  . ondansetron (ZOFRAN) 4 MG tablet Take 1 tablet (4 mg total) by mouth every 8 (eight) hours as needed for nausea or vomiting.  . cholecalciferol (VITAMIN D) 1000 UNITS tablet Take 1,000 Units by mouth daily.  . ferrous sulfate ([employer] IRON) 325 (65 FE) MG tablet Take 1 tablet (325 mg total) by mouth daily with breakfast.   No current facility-administered medications on file prior to visit.    Review of Systems Per HPI unless specifically indicated in ROS section     Objective:    BP 104/64 mmHg  Pulse 104  Temp(Src) 98.1 F (36.7 C) (Oral)  Wt 163 lb 4 oz (74.05 kg)  LMP 05/10/2011  Wt Readings from  Last 3 Encounters:  02/12/15 163 lb 4 oz (74.05 kg)  02/11/15 164 lb 8 oz (74.617 kg)  08/21/14 168 lb 4 oz (76.318 kg)    Physical Exam  Constitutional: She appears well-developed and well-nourished. No distress.  HENT:  Head: Normocephalic and atraumatic.  Right Ear: Hearing and external ear normal.  Left Ear: Hearing and external ear normal.  Nose: Mucosal edema (mildly injected nares) present. No rhinorrhea. Right sinus exhibits no maxillary sinus tenderness and no frontal sinus tenderness. Left sinus exhibits no maxillary sinus tenderness and no frontal sinus tenderness.  Mouth/Throat: Uvula is midline, oropharynx is clear and moist and mucous membranes are normal. No oropharyngeal exudate, posterior oropharyngeal edema, posterior oropharyngeal erythema or tonsillar abscesses.  Eyes: Conjunctivae and EOM are normal. Pupils are equal, round, and reactive to light. No scleral icterus.  Neck: Normal range of motion. Neck supple.  Cardiovascular: Normal rate, regular rhythm, normal heart sounds and intact distal pulses.   No murmur heard. Pulmonary/Chest: Effort normal and breath sounds normal. No respiratory distress. She has no wheezes. She has no rales.  Abdominal: Soft. Normal appearance and bowel sounds are normal. She exhibits no distension and no mass. There is hepatomegaly (~2 finger widths below costal margin). There is no hepatosplenomegaly or splenomegaly. There is tenderness (mild) in the right upper quadrant. There is no rigidity, no rebound, no guarding, no CVA tenderness and negative Murphy's  sign.  Lymphadenopathy:    She has no cervical adenopathy.  Skin: Skin is warm and dry. Rash noted.  Fine macular rash on upper arms, lower legs, abdomen. None on back. Dry skin present.  Nursing note and vitals reviewed.     Assessment & Plan:   Problem List Items Addressed This Visit    Viral exanthem - Primary    With recent viral URI, anticipate benign viral exanthem -  reassured. Should improve with time.       Hepatomegaly    Agree with PCP recent back and side soreness due to muscle aches from coughing fits. Abd soreness likely from recent vomiting which has now resolved. Hepatomegaly on exam - in h/o known gallstones and new mild RUQ discomfort on palpation, check abd US and if abnormal return for further labwork.       Relevant Orders   US Abdomen Limited RUQ       Follow up plan: Return if symptoms worsen or fail to improve.

## 2015-02-12 NOTE — Telephone Encounter (Signed)
Patient Name: Sara Leonard DOB: 1960-10-18 Initial Comment caller states she was vomiting and had fever yesterday - she was itching last night - now this am has a rash on arms, stomach, back , chest Nurse Assessment Nurse: Elijah Birkaldwell, RN, Stark BrayLynda Date/Time (Eastern Time): 02/12/2015 10:31:30 AM Confirm and document reason for call. If symptomatic, describe symptoms. ---Caller states she had had a cough & vomiting, fever yesterday. She was itching last night, now this am has a rash/hives on arms, stomach, back & chest. Had recently been seen after a stomach virus because her side was hurting. Has a steroid type cream, wondering if she should use it. Has the patient traveled out of the country within the last 30 days? ---Not Applicable Does the patient have any new or worsening symptoms? ---Yes Will a triage be completed? ---Yes Related visit to physician within the last 2 weeks? ---Yes Does the PT have any chronic conditions? (i.e. diabetes, asthma, etc.) ---Yes List chronic conditions. ---gall bladder issues, hives Is this a behavioral health or substance abuse call? ---No Guidelines Guideline Title Affirmed Question Affirmed Notes Rash or Redness - Widespread SEVERE itching (i.e., interferes with sleep, normal activities or school) Final Disposition User See Physician within 24 Hours Manzano Springsaldwell, RN, Stark BrayLynda Comments Caller gives cell # [phone]. She would like to be seen if possible, so nurse scheduled her this am. Referrals REFERRED TO PCP OFFICE Disagree/Comply: Comply

## 2015-02-13 ENCOUNTER — Telehealth: Payer: Self-pay | Admitting: Family Medicine

## 2015-02-13 DIAGNOSIS — B349 Viral infection, unspecified: Secondary | ICD-10-CM | POA: Insufficient documentation

## 2015-02-13 NOTE — Telephone Encounter (Signed)
Patient notified as instructed by telephone and verbalized understanding. 

## 2015-02-13 NOTE — Telephone Encounter (Signed)
Since she isn't jaundiced, I would only get the u/s done first.   Thanks.

## 2015-02-13 NOTE — Assessment & Plan Note (Signed)
Likely viral, nontoxic.  Should resolve.  Rest and sips of fluids. Wash hands frequently. Zofran as needed for nausea.  The muscle pain should gradually resolve, ie likely causing the abd wall pain.

## 2015-02-13 NOTE — Telephone Encounter (Signed)
Pt called about blood work checking her liver functions.  Pt is having soreness and tenderness with gall bladder and has controlled it with what she is eating.   cb number is cell [phone]

## 2015-02-15 ENCOUNTER — Telehealth: Payer: Self-pay

## 2015-02-15 ENCOUNTER — Ambulatory Visit (HOSPITAL_COMMUNITY)
Admission: RE | Admit: 2015-02-15 | Discharge: 2015-02-15 | Disposition: A | Discharge status: Home / Self Care | Payer: BC Managed Care – PPO | Source: Ambulatory Visit | Attending: Family Medicine | Admitting: Family Medicine

## 2015-02-15 ENCOUNTER — Other Ambulatory Visit (INDEPENDENT_AMBULATORY_CARE_PROVIDER_SITE_OTHER): Payer: BC Managed Care – PPO

## 2015-02-15 DIAGNOSIS — R1011 Right upper quadrant pain: Secondary | ICD-10-CM | POA: Diagnosis not present

## 2015-02-15 DIAGNOSIS — R160 Hepatomegaly, not elsewhere classified: Secondary | ICD-10-CM

## 2015-02-15 LAB — CBC WITH DIFFERENTIAL/PLATELET
BASOS ABS: 0.1 10*3/uL (ref 0.0–0.1)
Basophils Relative: 0.6 % (ref 0.0–3.0)
EOS ABS: 0.3 10*3/uL (ref 0.0–0.7)
Eosinophils Relative: 3.6 % (ref 0.0–5.0)
HEMATOCRIT: 38.5 % (ref 36.0–46.0)
Hemoglobin: 12.9 g/dL (ref 12.0–15.0)
LYMPHS PCT: 16.4 % (ref 12.0–46.0)
Lymphs Abs: 1.5 10*3/uL (ref 0.7–4.0)
MCHC: 33.4 g/dL (ref 30.0–36.0)
MCV: 84.5 fl (ref 78.0–100.0)
Monocytes Absolute: 0.5 10*3/uL (ref 0.1–1.0)
Monocytes Relative: 5.7 % (ref 3.0–12.0)
Neutro Abs: 6.7 10*3/uL (ref 1.4–7.7)
Neutrophils Relative %: 73.7 % (ref 43.0–77.0)
PLATELETS: 441 10*3/uL — AB (ref 150.0–400.0)
RBC: 4.55 Mil/uL (ref 3.87–5.11)
RDW: 12.3 % (ref 11.5–15.5)
WBC: 9.1 10*3/uL (ref 4.0–10.5)

## 2015-02-15 LAB — COMPREHENSIVE METABOLIC PANEL
ALBUMIN: 3.6 g/dL (ref 3.5–5.2)
ALT: 23 U/L (ref 0–35)
AST: 17 U/L (ref 0–37)
Alkaline Phosphatase: 112 U/L (ref 39–117)
BILIRUBIN TOTAL: 0.4 mg/dL (ref 0.2–1.2)
BUN: 12 mg/dL (ref 6–23)
CALCIUM: 9.8 mg/dL (ref 8.4–10.5)
CO2: 31 mEq/L (ref 19–32)
CREATININE: 0.77 mg/dL (ref 0.40–1.20)
Chloride: 101 mEq/L (ref 96–112)
GFR: 82.67 mL/min (ref >60.00)
Glucose, Bld: 160 mg/dL — ABNORMAL HIGH (ref 70–99)
Potassium: 3.9 mEq/L (ref 3.5–5.1)
Sodium: 139 mEq/L (ref 135–145)
Total Protein: 7.2 g/dL (ref 6.0–8.3)

## 2015-02-15 LAB — LIPASE: LIPASE: 25 U/L (ref 11.0–59.0)

## 2015-02-15 NOTE — Telephone Encounter (Signed)
Patient notified and lab appt scheduled. She said she is feeling MUCH better. Abd pain is gone and she has had no fevers.

## 2015-02-15 NOTE — Telephone Encounter (Signed)
[place] [facility] called US abdomen report; findings concerning for a degree of acute cholecystitis. Report taken to Dr Reece AgarG. Pt not waiting.

## 2015-02-15 NOTE — Telephone Encounter (Signed)
plz call patient - overall normal sized liver. Gallstones with gallbladder sludge. Gallbladder also showed some thickening of the wall which could be due to infection.  How is she feeling and any RUQ pain or more fevers? Recommend she come in today for stat labs for further evaluation of possible infection and we will call her as soon as labs return.

## 2015-02-15 NOTE — Telephone Encounter (Signed)
Noted. Will await labs.

## 2015-02-16 ENCOUNTER — Other Ambulatory Visit: Payer: Self-pay | Admitting: Family Medicine

## 2015-02-16 DIAGNOSIS — K8020 Calculus of gallbladder without cholecystitis without obstruction: Secondary | ICD-10-CM | POA: Insufficient documentation

## 2015-02-18 ENCOUNTER — Telehealth: Payer: Self-pay | Admitting: Family Medicine

## 2015-02-18 NOTE — Telephone Encounter (Signed)
Ok to do thanks. 

## 2015-02-18 NOTE — Telephone Encounter (Signed)
Spoke to pt about general surgery referral. She is planning on going out of town for Christmas and wants to make sure its ok to leave town? Please advise  She has not have any soreness in gallbladder area, she thinks the soreness came for coughing when she had a cold a few weeks ago. NO fever and no pain. Pt states she watches what she eats to avoid flare up's.   She wont be back in town until the beginning of the year so the Gen Surgery consult will not be until then.

## 2015-02-18 NOTE — Telephone Encounter (Signed)
Pt informed

## 2015-05-21 ENCOUNTER — Encounter: Payer: Self-pay | Admitting: Internal Medicine

## 2015-05-21 ENCOUNTER — Ambulatory Visit (INDEPENDENT_AMBULATORY_CARE_PROVIDER_SITE_OTHER): Payer: BC Managed Care – PPO | Admitting: Internal Medicine

## 2015-05-21 VITALS — BP 126/78 | HR 105 | Temp 99.8°F | Wt 160.0 lb

## 2015-05-21 DIAGNOSIS — J069 Acute upper respiratory infection, unspecified: Secondary | ICD-10-CM | POA: Diagnosis not present

## 2015-05-21 DIAGNOSIS — B9789 Other viral agents as the cause of diseases classified elsewhere: Principal | ICD-10-CM

## 2015-05-21 NOTE — Progress Notes (Signed)
Pre visit review using our clinic review tool, if applicable. No additional management support is needed unless otherwise documented below in the visit note. 

## 2015-05-21 NOTE — Progress Notes (Signed)
HPI  Pt presents to the clinic today with c/o runny nose, sore throat, cough and fever. This started 2-3 days ago. She is blowing clear mucous out of her nose. She denies difficulty swallowing. The cough is productive of clear mucous. She denies shortness of breath, chills or body aches. She has tried warm salt water gargles and Ibuprofen with minimal relief. She did not get her flu vaccine this year. She has no history of seasonal allergies. She has had sick contacts.  Review of Systems      Past Medical History  Diagnosis Date  . Iron deficiency     long standing, + FH and predates onset of menses  . Anxiety   . GERD (gastroesophageal reflux disease)   . White coat hypertension     Family History  Problem Relation Age of Onset  . Diabetes Mother   . Hypertension Mother   . Coronary artery disease Father   . Heart attack Father   . Hypertension Father   . Hyperlipidemia Father   . Kidney disease Father   . Diabetes Father   . Colon cancer Neg Hx   . Breast cancer Neg Hx     Social History   Social History  . Marital Status: Married    Spouse Name: N/A  . Number of Children: 2  . Years of Education: N/A   Occupational History  . hair dresser    Social History Main Topics  . Smoking status: Never Smoker   . Smokeless tobacco: Never Used  . Alcohol Use: No  . Drug Use: No  . Sexual Activity: Not on file   Other Topics Concern  . Not on file   Social History Narrative   Married 1988   1 son   hairdresser    Allergies  Allergen Reactions  . Penicillins     REACTION: rash  . Erythromycin Other (See Comments)    GI upset  . Influenza Vaccines     Local reaction, sig local erythema.    . Tdap [Diphth-Acell Pertussis-Tetanus] Other (See Comments)    Injection site reaction     Constitutional: Positive fever. Denies headache, fatigue or abrupt weight changes.  HEENT:  Positive runny nose, sore throat. Denies eye redness, eye pain, pressure behind the  eyes, facial pain, nasal congestion, ear pain, ringing in the ears, wax buildup or bloody nose. Respiratory: Positive cough. Denies difficulty breathing or shortness of breath.  Cardiovascular: Denies chest pain, chest tightness, palpitations or swelling in the hands or feet.   No other specific complaints in a complete review of systems (except as listed in HPI above).  Objective:   BP 126/78 mmHg  Pulse 105  Temp(Src) 99.8 F (37.7 C) (Oral)  Wt 160 lb (72.576 kg)  SpO2 97%  LMP 05/10/2011 Wt Readings from Last 3 Encounters:  05/21/15 160 lb (72.576 kg)  02/12/15 163 lb 4 oz (74.05 kg)  02/11/15 164 lb 8 oz (74.617 kg)     General: Appears her stated age, well developed, well nourished in NAD. HEENT: Head: normal shape and size, no sinus tenderness noted; Eyes: sclera white, no icterus, conjunctiva pink; Ears: Tm's gray and intact, normal light reflex; Nose: mucosa pink and moist, septum midline; Throat/Mouth: Teeth present, mucosa erythematous and moist, no exudate noted, no lesions or ulcerations noted.  Neck: No cervical lymphadenopathy.  Cardiovascular: Tachycardic with normal rhythm. S1,S2 noted.  No murmur, rubs or gallops noted.  Pulmonary/Chest: Normal effort and positive vesicular breath sounds. No  respiratory distress. No wheezes, rales or ronchi noted.      Assessment & Plan:   Viral Upper Respiratory Infection with Cough:  Get some rest and drink plenty of water Do salt water gargles for the sore throat Ibuprofen for fever and inflammation Flonase as needed for runny nose She declines RX for cough syrup  RTC as needed or if symptoms persist.

## 2015-05-21 NOTE — Patient Instructions (Signed)
Upper Respiratory Infection, Adult Most upper respiratory infections (URIs) are a viral infection of the air passages leading to the lungs. A URI affects the nose, throat, and upper air passages. The most common type of URI is nasopharyngitis and is typically referred to as "the common cold." URIs run their course and usually go away on their own. Most of the time, a URI does not require medical attention, but sometimes a bacterial infection in the upper airways can follow a viral infection. This is called a secondary infection. Sinus and middle ear infections are common types of secondary upper respiratory infections. Bacterial pneumonia can also complicate a URI. A URI can worsen asthma and chronic obstructive pulmonary disease (COPD). Sometimes, these complications can require emergency medical care and may be life threatening.  CAUSES Almost all URIs are caused by viruses. A virus is a type of germ and can spread from one person to another.  RISKS FACTORS You may be at risk for a URI if:   You smoke.   You have chronic heart or lung disease.  You have a weakened defense (immune) system.   You are very young or very old.   You have nasal allergies or asthma.  You work in crowded or poorly ventilated areas.  You work in health care facilities or schools. SIGNS AND SYMPTOMS  Symptoms typically develop 2-3 days after you come in contact with a cold virus. Most viral URIs last 7-10 days. However, viral URIs from the influenza virus (flu virus) can last 14-18 days and are typically more severe. Symptoms may include:   Runny or stuffy (congested) nose.   Sneezing.   Cough.   Sore throat.   Headache.   Fatigue.   Fever.   Loss of appetite.   Pain in your forehead, behind your eyes, and over your cheekbones (sinus pain).  Muscle aches.  DIAGNOSIS  Your health care provider may diagnose a URI by:  Physical exam.  Tests to check that your symptoms are not due to  another condition such as:  Strep throat.  Sinusitis.  Pneumonia.  Asthma. TREATMENT  A URI goes away on its own with time. It cannot be cured with medicines, but medicines may be prescribed or recommended to relieve symptoms. Medicines may help:  Reduce your fever.  Reduce your cough.  Relieve nasal congestion. HOME CARE INSTRUCTIONS   Take medicines only as directed by your health care provider.   Gargle warm saltwater or take cough drops to comfort your throat as directed by your health care provider.  Use a warm mist humidifier or inhale steam from a shower to increase air moisture. This may make it easier to breathe.  Drink enough fluid to keep your urine clear or pale yellow.   Eat soups and other clear broths and maintain good nutrition.   Rest as needed.   Return to work when your temperature has returned to normal or as your health care provider advises. You may need to stay home longer to avoid infecting others. You can also use a face mask and careful hand washing to prevent spread of the virus.  Increase the usage of your inhaler if you have asthma.   Do not use any tobacco products, including cigarettes, chewing tobacco, or electronic cigarettes. If you need help quitting, ask your health care provider. PREVENTION  The best way to protect yourself from getting a cold is to practice good hygiene.   Avoid oral or hand contact with people with cold   symptoms.   Wash your hands often if contact occurs.  There is no clear evidence that vitamin C, vitamin E, echinacea, or exercise reduces the chance of developing a cold. However, it is always recommended to get plenty of rest, exercise, and practice good nutrition.  SEEK MEDICAL CARE IF:   You are getting worse rather than better.   Your symptoms are not controlled by medicine.   You have chills.  You have worsening shortness of breath.  You have brown or red mucus.  You have yellow or brown nasal  discharge.  You have pain in your face, especially when you bend forward.  You have a fever.  You have swollen neck glands.  You have pain while swallowing.  You have white areas in the back of your throat. SEEK IMMEDIATE MEDICAL CARE IF:   You have severe or persistent:  Headache.  Ear pain.  Sinus pain.  Chest pain.  You have chronic lung disease and any of the following:  Wheezing.  Prolonged cough.  Coughing up blood.  A change in your usual mucus.  You have a stiff neck.  You have changes in your:  Vision.  Hearing.  Thinking.  Mood. MAKE SURE YOU:   Understand these instructions.  Will watch your condition.  Will get help right away if you are not doing well or get worse.   This information is not intended to replace advice given to you by your health care provider. Make sure you discuss any questions you have with your health care provider.   Document Released: 08/12/2000 Document Revised: 07/03/2014 Document Reviewed: 05/24/2013 Elsevier Interactive Patient Education 2016 [employer]  

## 2015-06-20 ENCOUNTER — Other Ambulatory Visit: Payer: Self-pay | Admitting: *Deleted

## 2015-06-20 MED ORDER — OMEPRAZOLE 20 MG PO CPDR: 20.0000 mg | DELAYED_RELEASE_CAPSULE | Freq: Every day | ORAL | Status: DC

## 2015-06-21 ENCOUNTER — Other Ambulatory Visit: Payer: Self-pay | Admitting: *Deleted

## 2015-07-22 ENCOUNTER — Other Ambulatory Visit: Payer: Self-pay | Admitting: Family Medicine

## 2015-07-22 DIAGNOSIS — E7800 Pure hypercholesterolemia, unspecified: Secondary | ICD-10-CM

## 2015-07-22 DIAGNOSIS — E559 Vitamin D deficiency, unspecified: Secondary | ICD-10-CM

## 2015-07-22 DIAGNOSIS — D649 Anemia, unspecified: Secondary | ICD-10-CM

## 2015-07-30 ENCOUNTER — Other Ambulatory Visit (INDEPENDENT_AMBULATORY_CARE_PROVIDER_SITE_OTHER): Payer: BC Managed Care – PPO

## 2015-07-30 DIAGNOSIS — E7800 Pure hypercholesterolemia, unspecified: Secondary | ICD-10-CM | POA: Diagnosis not present

## 2015-07-30 DIAGNOSIS — E559 Vitamin D deficiency, unspecified: Secondary | ICD-10-CM | POA: Diagnosis not present

## 2015-07-30 DIAGNOSIS — D649 Anemia, unspecified: Secondary | ICD-10-CM

## 2015-07-30 LAB — LIPID PANEL
CHOLESTEROL: 183 mg/dL (ref 0–200)
HDL: 51 mg/dL (ref >39.00)
LDL CALC: 121 mg/dL — AB (ref 0–99)
NonHDL: 131.97
TRIGLYCERIDES: 54 mg/dL (ref 0.0–149.0)
Total CHOL/HDL Ratio: 4
VLDL: 10.8 mg/dL (ref 0.0–40.0)

## 2015-07-30 LAB — CBC WITH DIFFERENTIAL/PLATELET
BASOS ABS: 0 10*3/uL (ref 0.0–0.1)
BASOS PCT: 0.7 % (ref 0.0–3.0)
EOS ABS: 0.3 10*3/uL (ref 0.0–0.7)
Eosinophils Relative: 5.9 % — ABNORMAL HIGH (ref 0.0–5.0)
HCT: 39.9 % (ref 36.0–46.0)
HEMOGLOBIN: 13.6 g/dL (ref 12.0–15.0)
LYMPHS PCT: 26.3 % (ref 12.0–46.0)
Lymphs Abs: 1.5 10*3/uL (ref 0.7–4.0)
MCHC: 34 g/dL (ref 30.0–36.0)
MCV: 83.6 fl (ref 78.0–100.0)
MONO ABS: 0.4 10*3/uL (ref 0.1–1.0)
Monocytes Relative: 7.3 % (ref 3.0–12.0)
Neutro Abs: 3.4 10*3/uL (ref 1.4–7.7)
Neutrophils Relative %: 59.8 % (ref 43.0–77.0)
Platelets: 264 10*3/uL (ref 150.0–400.0)
RBC: 4.77 Mil/uL (ref 3.87–5.11)
RDW: 13.5 % (ref 11.5–15.5)
WBC: 5.6 10*3/uL (ref 4.0–10.5)

## 2015-07-30 LAB — VITAMIN D 25 HYDROXY (VIT D DEFICIENCY, FRACTURES): VITD: 31.38 ng/mL (ref 30.00–100.00)

## 2015-07-30 LAB — COMPREHENSIVE METABOLIC PANEL
ALBUMIN: 4.1 g/dL (ref 3.5–5.2)
ALK PHOS: 72 U/L (ref 39–117)
ALT: 17 U/L (ref 0–35)
AST: 17 U/L (ref 0–37)
BILIRUBIN TOTAL: 0.5 mg/dL (ref 0.2–1.2)
BUN: 15 mg/dL (ref 6–23)
CALCIUM: 9.8 mg/dL (ref 8.4–10.5)
CHLORIDE: 105 meq/L (ref 96–112)
CO2: 29 mEq/L (ref 19–32)
CREATININE: 0.79 mg/dL (ref 0.40–1.20)
GFR: 80.13 mL/min (ref >60.00)
Glucose, Bld: 126 mg/dL — ABNORMAL HIGH (ref 70–99)
Potassium: 4.2 mEq/L (ref 3.5–5.1)
Sodium: 140 mEq/L (ref 135–145)
TOTAL PROTEIN: 6.7 g/dL (ref 6.0–8.3)

## 2015-07-30 LAB — VITAMIN B12: VITAMIN B 12: 360 pg/mL (ref 211–911)

## 2015-08-05 ENCOUNTER — Ambulatory Visit (INDEPENDENT_AMBULATORY_CARE_PROVIDER_SITE_OTHER): Payer: BC Managed Care – PPO | Admitting: Family Medicine

## 2015-08-05 ENCOUNTER — Encounter: Payer: Self-pay | Admitting: Family Medicine

## 2015-08-05 VITALS — BP 122/80 | HR 95 | Temp 98.2°F | Ht 60.0 in | Wt 161.8 lb

## 2015-08-05 DIAGNOSIS — Z0000 Encounter for general adult medical examination without abnormal findings: Secondary | ICD-10-CM

## 2015-08-05 DIAGNOSIS — Z1211 Encounter for screening for malignant neoplasm of colon: Secondary | ICD-10-CM

## 2015-08-05 DIAGNOSIS — R739 Hyperglycemia, unspecified: Secondary | ICD-10-CM

## 2015-08-05 DIAGNOSIS — D509 Iron deficiency anemia, unspecified: Secondary | ICD-10-CM

## 2015-08-05 DIAGNOSIS — Z119 Encounter for screening for infectious and parasitic diseases, unspecified: Secondary | ICD-10-CM

## 2015-08-05 DIAGNOSIS — E559 Vitamin D deficiency, unspecified: Secondary | ICD-10-CM

## 2015-08-05 DIAGNOSIS — L509 Urticaria, unspecified: Secondary | ICD-10-CM | POA: Insufficient documentation

## 2015-08-05 DIAGNOSIS — K8020 Calculus of gallbladder without cholecystitis without obstruction: Secondary | ICD-10-CM

## 2015-08-05 MED ORDER — OMEPRAZOLE 20 MG PO CPDR: 20.0000 mg | DELAYED_RELEASE_CAPSULE | Freq: Every day | ORAL | Status: DC

## 2015-08-05 MED ORDER — TRIAMCINOLONE ACETONIDE 0.1 % EX CREA: 1.0000 "application " | TOPICAL_CREAM | Freq: Two times a day (BID) | CUTANEOUS | Status: DC | PRN

## 2015-08-05 NOTE — Assessment & Plan Note (Signed)
No sx with diet adherence.

## 2015-08-05 NOTE — Patient Instructions (Signed)
Take care.  Glad to see you.  Keep working on M.D.[employer]. Holdingsyour diet.   Update me as needed.

## 2015-08-05 NOTE — Assessment & Plan Note (Signed)
Continue work on diet and exercise. Labs d/w pt.  

## 2015-08-05 NOTE — Assessment & Plan Note (Signed)
Improved, d/w pt.  

## 2015-08-05 NOTE — Progress Notes (Signed)
Pre visit review using our clinic review tool, if applicable. No additional management support is needed unless otherwise documented below in the visit note.  CPE- See plan.  Routine anticipatory guidance given to patient.  See health maintenance. Tetanus 2006- she describes sig local injection site reaction. She doesn't have high risk exposures (ie young kids for tdap) PNA and shingles not due.  Flu shot not indicated due to prev reaction.  Mammogram and breast exam, per gyn. Pap up to date. Per gyn. Pap last done ~2 years ago.   D/w patient [id number]:options for colon cancer screening, including IFOB vs. colonoscopy. Risks and benefits of both were discussed and patient voiced understanding. Pt elects for: IFOB.  Living will. D/w pt. Would have her husband designated.  Diet and exercise d/w pt. She is still working on weight and diet. Discussed. Gradual weight loss noted.  She has known gall stones but hasn't had sx in the meantime with diet adherence.   Labs d/w pt.  HIV testing prev neg during her pregnancy in 1994.   Pt opts in for HCV screening.  D/w pt re: routine screening.    Sugar has been ~110-120 at home.  No meds.  Working on gradual weight loss.   occ hives, using TAC prn w/o ADE.  Needed refill, sent.   GERD sx controlled.  Taking least amount of PPI possible, with some intentional skipped days.  D/w pt about possible PPI taper.    H/o anemia.  CBC wnl, HGB wnl.  Still on iron.   PMH and SH reviewed  Meds, vitals, and allergies reviewed.   ROS: Per HPI.  Unless specifically indicated otherwise in HPI, the patient denies:  General: fever. Eyes: acute vision changes ENT: sore throat Cardiovascular: chest pain Respiratory: SOB GI: vomiting GU: dysuria Musculoskeletal: acute back pain Derm: acute rash Neuro: acute motor dysfunction Psych: worsening mood Endocrine: polydipsia Heme: bleeding Allergy: hayfever  GEN: nad, alert and oriented HEENT: mucous  membranes moist NECK: supple w/o LA CV: rrr. PULM: ctab, no inc wob ABD: soft, +bs EXT: no edema SKIN: no acute rash

## 2015-08-05 NOTE — Assessment & Plan Note (Signed)
Use prn TAC.

## 2015-08-05 NOTE — Assessment & Plan Note (Signed)
Continue iron, not anemic now.  D/w pt.

## 2015-08-05 NOTE — Assessment & Plan Note (Signed)
Tetanus 2006- she describes sig local injection site reaction. She doesn't have high risk exposures (ie young kids for tdap)  PNA and shingles not due.  Flu shot not indicated due to prev reaction.  Mammogram and breast exam, per gyn.  Pap up to date. Per gyn. Pap last done ~2 years ago.  D/w patient [id number]:options for colon cancer screening, including IFOB vs. colonoscopy. Risks and benefits of both were discussed and patient voiced understanding. Pt elects for: IFOB.  Living will. D/w pt. Would have her husband designated.  Diet and exercise d/w pt. She is still working on weight and diet. Discussed. Gradual weight loss noted. She has known gall stones but hasn't had sx in the meantime with diet adherence.  Labs d/w pt.  HIV testing prev neg during her pregnancy in 1994.  Pt opts in for HCV screening. D/w pt re: routine screening.

## 2015-08-20 ENCOUNTER — Other Ambulatory Visit (INDEPENDENT_AMBULATORY_CARE_PROVIDER_SITE_OTHER): Payer: BC Managed Care – PPO

## 2015-08-20 ENCOUNTER — Encounter: Payer: Self-pay | Admitting: *Deleted

## 2015-08-20 DIAGNOSIS — Z1211 Encounter for screening for malignant neoplasm of colon: Secondary | ICD-10-CM

## 2015-08-20 LAB — FECAL OCCULT BLOOD, IMMUNOCHEMICAL: FECAL OCCULT BLD: NEGATIVE

## 2016-03-16 LAB — HM PAP SMEAR

## 2016-03-23 ENCOUNTER — Encounter: Payer: Self-pay | Admitting: Family Medicine

## 2016-03-30 ENCOUNTER — Ambulatory Visit (INDEPENDENT_AMBULATORY_CARE_PROVIDER_SITE_OTHER): Payer: BC Managed Care – PPO | Admitting: Family Medicine

## 2016-03-30 ENCOUNTER — Encounter: Payer: Self-pay | Admitting: Family Medicine

## 2016-03-30 VITALS — BP 158/86 | HR 92 | Temp 98.2°F | Wt 168.2 lb

## 2016-03-30 DIAGNOSIS — J309 Allergic rhinitis, unspecified: Secondary | ICD-10-CM | POA: Diagnosis not present

## 2016-03-30 NOTE — Patient Instructions (Signed)
Allegra, Claritin, or Zyrtec can be used with flonase for symptoms of allergic rhinitis. If symptoms do not improve with treatment, worsen, or you develop a fever >100, please follow up for further evaluation and treatment.  Allergic Rhinitis Allergic rhinitis is when the mucous membranes in the nose respond to allergens. Allergens are particles in the air that cause your body to have an allergic reaction. This causes you to release allergic antibodies. Through a chain of events, these eventually cause you to release histamine into the blood stream. Although meant to protect the body, it is this release of histamine that causes your discomfort, such as frequent sneezing, congestion, and an itchy, runny nose. What are the causes? Seasonal allergic rhinitis (hay fever) is caused by pollen allergens that may come from grasses, trees, and weeds. Year-round allergic rhinitis (perennial allergic rhinitis) is caused by allergens such as house dust mites, pet dander, and mold spores. What are the signs or symptoms?  Nasal stuffiness (congestion).  Itchy, runny nose with sneezing and tearing of the eyes. How is this diagnosed? Your health care provider can help you determine the allergen or allergens that trigger your symptoms. If you and your health care provider are unable to determine the allergen, skin or blood testing may be used. Your health care provider will diagnose your condition after taking your health history and performing a physical exam. Your health care provider may assess you for other related conditions, such as asthma, pink eye, or an ear infection. How is this treated? Allergic rhinitis does not have a cure, but it can be controlled by:  Medicines that block allergy symptoms. These may include allergy shots, nasal sprays, and oral antihistamines.  Avoiding the allergen. Hay fever may often be treated with antihistamines in pill or nasal spray forms. Antihistamines block the effects of  histamine. There are over-the-counter medicines that may help with nasal congestion and swelling around the eyes. Check with your health care provider before taking or giving this medicine. If avoiding the allergen or the medicine prescribed do not work, there are many new medicines your health care provider can prescribe. Stronger medicine may be used if initial measures are ineffective. Desensitizing injections can be used if medicine and avoidance does not work. Desensitization is when a patient is given ongoing shots until the body becomes less sensitive to the allergen. Make sure you follow up with your health care provider if problems continue. Follow these instructions at home: It is not possible to completely avoid allergens, but you can reduce your symptoms by taking steps to limit your exposure to them. It helps to know exactly what you are allergic to so that you can avoid your specific triggers. Contact a health care provider if:  You have a fever.  You develop a cough that does not stop easily (persistent).  You have shortness of breath.  You start wheezing.  Symptoms interfere with normal daily activities. This information is not intended to replace advice given to you by your health care provider. Make sure you discuss any questions you have with your health care provider. Document Released: 11/11/2000 Document Revised: 10/18/2015 Document Reviewed: 10/24/2012 Elsevier Interactive Patient Education  2017 [employer]

## 2016-03-30 NOTE — Progress Notes (Signed)
Pre visit review using our clinic review tool, if applicable. No additional management support is needed unless otherwise documented below in the visit note. 

## 2016-03-30 NOTE — Progress Notes (Signed)
Subjective:    Patient ID: Sara Leonard, female    DOB: 1960-03-20, 56 y.o.   MRN: [id number]  HPI  Sara Leonard is a 56 year old female who presents today with post nasal drip that has been present for 3 days.  Associated symptoms of itchy, watery eyes are noted.  She denies fever, chills, sweats, N/VD, cough, ear pain, sinus pressure/pain, tooth pain. No treatments have been initiated at home. Recent sick contact exposure at home with husband. No history of asthma/bronchitis. She is a Interior and spatial designerhairdresser. She is a nonsmoker.   Review of Systems  Constitutional: Negative for chills, fatigue and fever.  HENT: Positive for postnasal drip. Negative for congestion, rhinorrhea, sinus pain, sinus pressure, sneezing and sore throat.   Eyes: Positive for itching.  Respiratory: Negative for cough, shortness of breath and wheezing.   Cardiovascular: Negative for chest pain and palpitations.  Gastrointestinal: Negative for abdominal pain, diarrhea, nausea and vomiting.   Past Medical History:  Diagnosis Date  . Anxiety   . GERD (gastroesophageal reflux disease)   . Iron deficiency    long standing, + FH and predates onset of menses  . White coat hypertension      Social History   Social History  . Marital status: Married    Spouse name: N/A  . Number of children: 2  . Years of education: N/A   Occupational History  . hair dresser Self-Employed   Social History Main Topics  . Smoking status: Never Smoker  . Smokeless tobacco: Never Used  . Alcohol use No  . Drug use: No  . Sexual activity: Not on file   Other Topics Concern  . 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 CESAREAN SECTION    . TONSILLECTOMY      Family History  Problem Relation Age of Onset  . Diabetes Mother   . Hypertension Mother   . Coronary artery disease Father   . Heart attack Father   . Hypertension Father    . Hyperlipidemia Father   . Kidney disease Father   . Diabetes Father   . Colon cancer Neg Hx   . Breast cancer Neg Hx     Allergies  Allergen Reactions  . Penicillins     REACTION: rash  . Erythromycin Other (See Comments)    GI upset  . Influenza Vaccines     Local reaction, sig local erythema.    . Tdap [Diphth-Acell Pertussis-Tetanus] Other (See Comments)    Injection site reaction    Current Outpatient Prescriptions on File Prior to Visit  Medication Sig Dispense Refill  . ferrous sulfate 325 (65 FE) MG tablet Take 325 mg by mouth daily with breakfast.    . glucose blood (ONETOUCH VERIO) test strip Check blood sugar once daily and as directed. R73.9 100 each 3  . omeprazole (PRILOSEC) 20 MG capsule Take 1-2 capsules (20-40 mg total) by mouth daily. 180 capsule 3  . triamcinolone cream (KENALOG) 0.1 % Apply 1 application topically 2 (two) times daily as needed. 453.6 g 0   No current facility-administered medications on file prior to visit.     BP (!) 158/86 (BP Location: Left Arm, Patient Position: Sitting, Cuff Size: Normal)   Pulse 92   Temp 98.2 F (36.8 C) (Oral)   Wt 168 lb 3.2 oz (76.3 kg)   LMP  05/10/2011   SpO2 98%   BMI 32.85 kg/m        Objective:   Physical Exam  Constitutional: She is oriented to person, place, and time. She appears well-developed and well-nourished.  Eyes: Pupils are equal, round, and reactive to light. No scleral icterus.  Neck: Neck supple.  Cardiovascular: Normal rate and regular rhythm.   Pulmonary/Chest: Effort normal and breath sounds normal. She has no wheezes. She has no rales.  Lymphadenopathy:    She has no cervical adenopathy.  Neurological: She is alert and oriented to person, place, and time.  Skin: Skin is warm and dry. No rash noted.          Assessment & Plan:  1. Allergic rhinitis, unspecified chronicity, unspecified seasonality, unspecified trigger Allegra, Claritin, or Zyrtec can be used with flonase  for symptoms of allergic rhinitis. Follow up if symptoms do not improve in 3 to 4 days with treatment, worsen, or she develops a fever >100.  Roddie Mc, FNP-C

## 2016-03-31 ENCOUNTER — Encounter: Payer: Self-pay | Admitting: Primary Care

## 2016-03-31 ENCOUNTER — Ambulatory Visit (INDEPENDENT_AMBULATORY_CARE_PROVIDER_SITE_OTHER): Payer: BC Managed Care – PPO | Admitting: Primary Care

## 2016-03-31 VITALS — BP 126/84 | HR 104 | Temp 100.6°F | Ht 60.0 in | Wt 167.4 lb

## 2016-03-31 DIAGNOSIS — R6889 Other general symptoms and signs: Secondary | ICD-10-CM

## 2016-03-31 NOTE — Progress Notes (Signed)
Subjective:    Patient ID: Sara Leonard, female    DOB: 03-01-1961, 56 y.o.   MRN: [id number]  HPI  Sara Leonard is a 56 year old female who presents today with a chief complaint of fever. She also reports cough, headache, fatigue, scratchy throat. Her symptoms began 3 days ago. Her family member was just diagnosed with influenza. Her fevers are running 100-102. She's taken Tylenol with improvement in fevers. She denies body aches. She has not had her flu shot as she is allergic.  Review of Systems  Constitutional: Positive for chills, fatigue and fever.  HENT: Positive for sore throat. Negative for congestion and postnasal drip.   Respiratory: Positive for cough.   Musculoskeletal: Negative for myalgias.       Past Medical History:  Diagnosis Date  . Anxiety   . GERD (gastroesophageal reflux disease)   . Iron deficiency    long standing, + FH and predates onset of menses  . White coat hypertension      Social History   Social History  . Marital status: Married    Spouse name: N/A  . Number of children: 2  . Years of education: N/A   Occupational History  . hair dresser Self-Employed   Social History Main Topics  . Smoking status: Never Smoker  . Smokeless tobacco: Never Used  . Alcohol use No  . Drug use: No  . Sexual activity: Not on file   Other Topics Concern  . 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 CESAREAN SECTION    . TONSILLECTOMY      Family History  Problem Relation Age of Onset  . Diabetes Mother   . Hypertension Mother   . Coronary artery disease Father   . Heart attack Father   . Hypertension Father   . Hyperlipidemia Father   . Kidney disease Father   . Diabetes Father   . Colon cancer Neg Hx   . Breast cancer Neg Hx     Allergies  Allergen Reactions  . Penicillins     REACTION: rash  . Erythromycin Other (See Comments)    GI  upset  . Influenza Vaccines     Local reaction, sig local erythema.    . Tdap [Diphth-Acell Pertussis-Tetanus] Other (See Comments)    Injection site reaction    Current Outpatient Prescriptions on File Prior to Visit  Medication Sig Dispense Refill  . ferrous sulfate 325 (65 FE) MG tablet Take 325 mg by mouth daily with breakfast.    . glucose blood (ONETOUCH VERIO) test strip Check blood sugar once daily and as directed. R73.9 100 each 3  . omeprazole (PRILOSEC) 20 MG capsule Take 1-2 capsules (20-40 mg total) by mouth daily. 180 capsule 3  . triamcinolone cream (KENALOG) 0.1 % Apply 1 application topically 2 (two) times daily as needed. 453.6 g 0   No current facility-administered medications on file prior to visit.     BP 126/84   Pulse (!) 104   Temp (!) 100.6 F (38.1 C) (Oral)   Ht 5' (1.524 m)   Wt 167 lb 6.4 oz (75.9 kg)   LMP 05/10/2011   SpO2 96%   BMI 32.69 kg/m    Objective:   Physical Exam  Constitutional: She appears well-nourished. She does not appear ill.  HENT:  Right Ear: Tympanic membrane  and ear canal normal.  Left Ear: Tympanic membrane and ear canal normal.  Nose: Right sinus exhibits no maxillary sinus tenderness and no frontal sinus tenderness. Left sinus exhibits no maxillary sinus tenderness and no frontal sinus tenderness.  Mouth/Throat: Oropharynx is clear and moist.  Eyes: Conjunctivae are normal.  Neck: Neck supple.  Cardiovascular: Regular rhythm.   Sinus tachycardia  Pulmonary/Chest: Effort normal and breath sounds normal. She has no wheezes. She has no rales.  Lymphadenopathy:    She has no cervical adenopathy.  Skin: Skin is warm and dry.          Assessment & Plan:  Viral Illness:  Mild cough, fatigue, fevers, scratchy throat x 3 days ago. Son diagnosed with influenza today. Temporary improvement in fevers with tylenol.  Exam today with clear lungs, sinus tachycardia, overall unremarkable. Due to critically low level of  influenza testing kits, will refrain from testing given that her illness represents viral etiology and that she is out of the window for Tamiflu.  She is stable for outpatient treatment. Suspect viral URI and will treat with supportive measures.  Tylenol/ibuprofen for fevers, Delsym for cough. Return precautions provided.  Morrie Sheldonlark,Katherine Kendal, NP

## 2016-03-31 NOTE — Progress Notes (Signed)
Pre visit review using our clinic review tool, if applicable. No additional management support is needed unless otherwise documented below in the visit note. 

## 2016-03-31 NOTE — Patient Instructions (Signed)
You may alternate tylenol and ibuprofen for fevers/headaches/body aches. Do not exceed 3000 mg of tylenol and 2400 mg of ibuprofen in 24 hours.  You may take Delsym as needed for cough.  Ensure you are staying hydrated with water and rest.  Please notify me if you develop persistent fevers of 101 that don't improve by Monday next week, start coughing up green mucous, notice increased fatigue or weakness, or feel worse after 1 week of onset of symptoms.   Increase consumption of water intake and rest.  It was a pleasure meeting you!   Upper Respiratory Infection, Adult Most upper respiratory infections (URIs) are a viral infection of the air passages leading to the lungs. A URI affects the nose, throat, and upper air passages. The most common type of URI is nasopharyngitis and is typically referred to as "the common cold." URIs run their course and usually go away on their own. Most of the time, a URI does not require medical attention, but sometimes a bacterial infection in the upper airways can follow a viral infection. This is called a secondary infection. Sinus and middle ear infections are common types of secondary upper respiratory infections. Bacterial pneumonia can also complicate a URI. A URI can worsen asthma and chronic obstructive pulmonary disease (COPD). Sometimes, these complications can require emergency medical care and may be life threatening. What are the causes? Almost all URIs are caused by viruses. A virus is a type of germ and can spread from one person to another. What increases the risk? You may be at risk for a URI if:  You smoke.  You have chronic heart or lung disease.  You have a weakened defense (immune) system.  You are very young or very old.  You have nasal allergies or asthma.  You work in crowded or poorly ventilated areas.  You work in health care facilities or schools. What are the signs or symptoms? Symptoms typically develop 2-3 days after you  come in contact with a cold virus. Most viral URIs last 7-10 days. However, viral URIs from the influenza virus (flu virus) can last 14-18 days and are typically more severe. Symptoms may include:  Runny or stuffy (congested) nose.  Sneezing.  Cough.  Sore throat.  Headache.  Fatigue.  Fever.  Loss of appetite.  Pain in your forehead, behind your eyes, and over your cheekbones (sinus pain).  Muscle aches. How is this diagnosed? Your health care provider may diagnose a URI by:  Physical exam.  Tests to check that your symptoms are not due to another condition such as:  Strep throat.  Sinusitis.  Pneumonia.  Asthma. How is this treated? A URI goes away on its own with time. It cannot be cured with medicines, but medicines may be prescribed or recommended to relieve symptoms. Medicines may help:  Reduce your fever.  Reduce your cough.  Relieve nasal congestion. Follow these instructions at home:  Take medicines only as directed by your health care provider.  Gargle warm saltwater or take cough drops to comfort your throat as directed by your health care provider.  Use a warm mist humidifier or inhale steam from a shower to increase air moisture. This may make it easier to breathe.  Drink enough fluid to keep your urine clear or pale yellow.  Eat soups and other clear broths and maintain good nutrition.  Rest as needed.  Return to work when your temperature has returned to normal or as your health care provider advises. You  may need to stay home longer to avoid infecting others. You can also use a face mask and careful hand washing to prevent spread of the virus.  Increase the usage of your inhaler if you have asthma.  Do not use any tobacco products, including cigarettes, chewing tobacco, or electronic cigarettes. If you need help quitting, ask your health care provider. How is this prevented? The best way to protect yourself from getting a cold is to  practice good hygiene.  Avoid oral or hand contact with people with cold symptoms.  Wash your hands often if contact occurs. There is no clear evidence that vitamin C, vitamin E, echinacea, or exercise reduces the chance of developing a cold. However, it is always recommended to get plenty of rest, exercise, and practice good nutrition. Contact a health care provider if:  You are getting worse rather than better.  Your symptoms are not controlled by medicine.  You have chills.  You have worsening shortness of breath.  You have brown or red mucus.  You have yellow or brown nasal discharge.  You have pain in your face, especially when you bend forward.  You have a fever.  You have swollen neck glands.  You have pain while swallowing.  You have white areas in the back of your throat. Get help right away if:  You have severe or persistent:  Headache.  Ear pain.  Sinus pain.  Chest pain.  You have chronic lung disease and any of the following:  Wheezing.  Prolonged cough.  Coughing up blood.  A change in your usual mucus.  You have a stiff neck.  You have changes in your:  Vision.  Hearing.  Thinking.  Mood. This information is not intended to replace advice given to you by your health care provider. Make sure you discuss any questions you have with your health care provider. Document Released: 08/12/2000 Document Revised: 10/20/2015 Document Reviewed: 05/24/2013 Elsevier Interactive Patient Education  2017 [employer].

## 2016-07-11 IMAGING — US US ABDOMEN LIMITED
1 series · 14 of 25 positions shown · non-contrast
Comparison: None.

CLINICAL DATA: Hepatomegaly and cholelithiasis

EXAM:
US ABDOMEN LIMITED - RIGHT UPPER QUADRANT

[Series 1: us abdomen limited · 0.26mm/px · 14 of 79 slices shown]
[im 1/79]
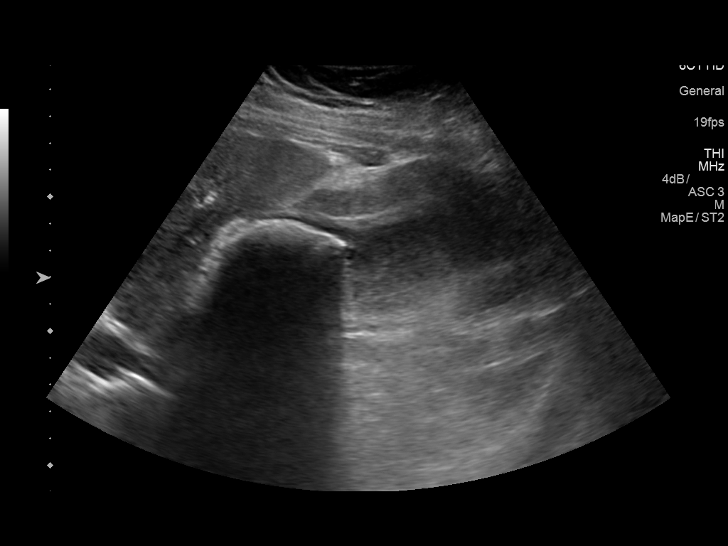
[im 7/79]
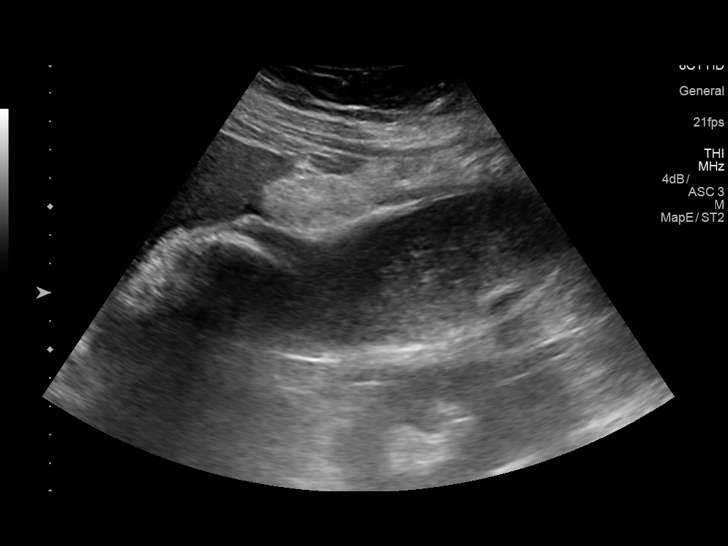
[im 14/79]
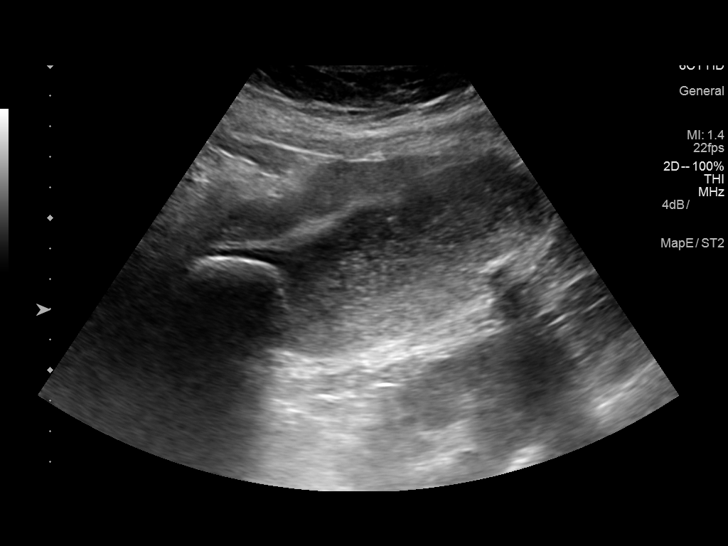
[im 20/79]
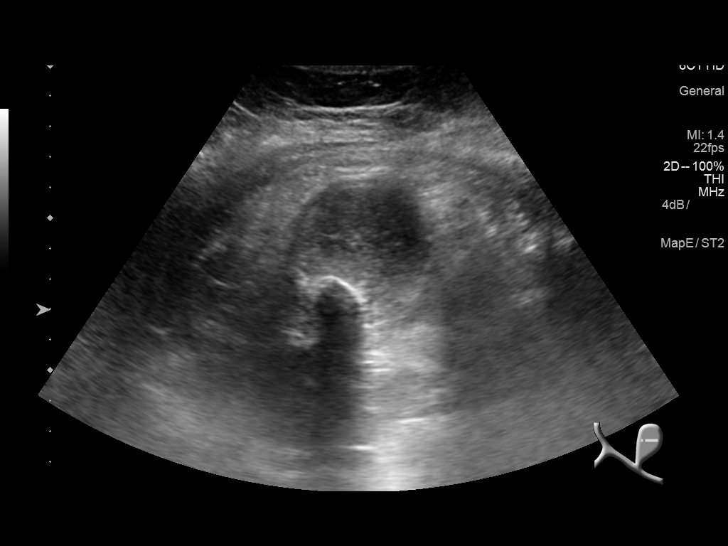
[im 27/79]
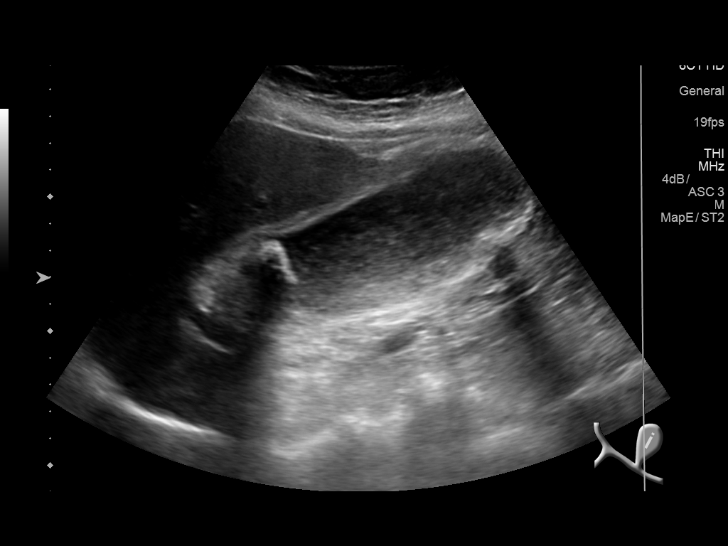
[im 30/79]
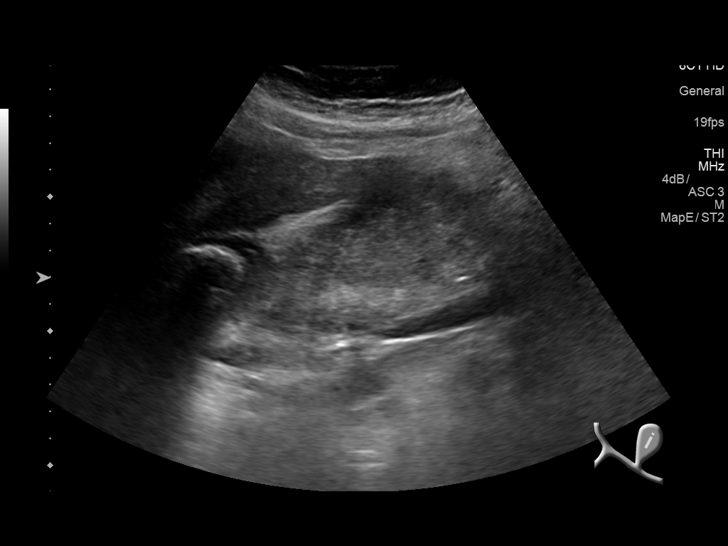
[im 36/79]
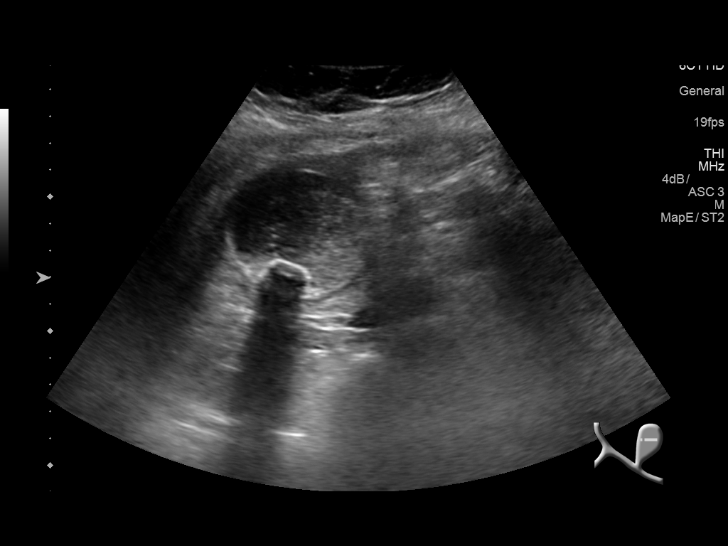
[im 43/79]
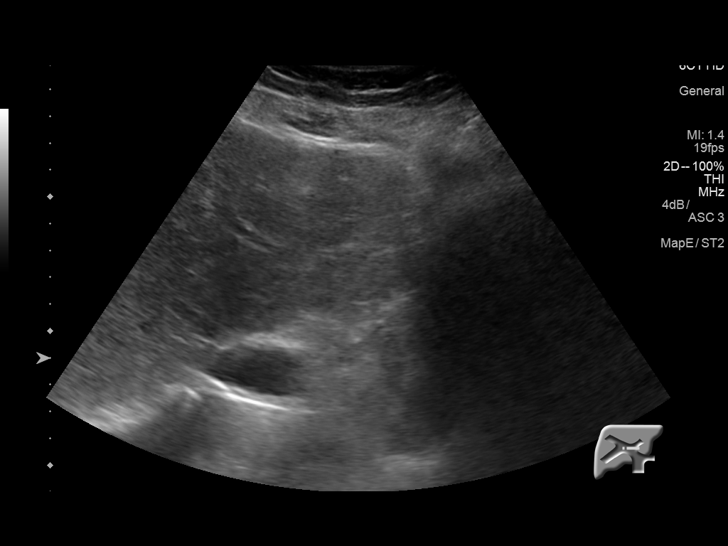
[im 49/79]
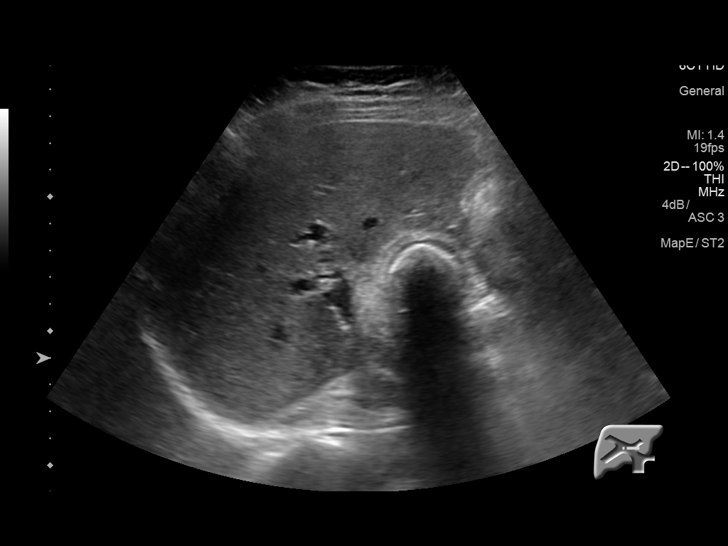
[im 53/79]
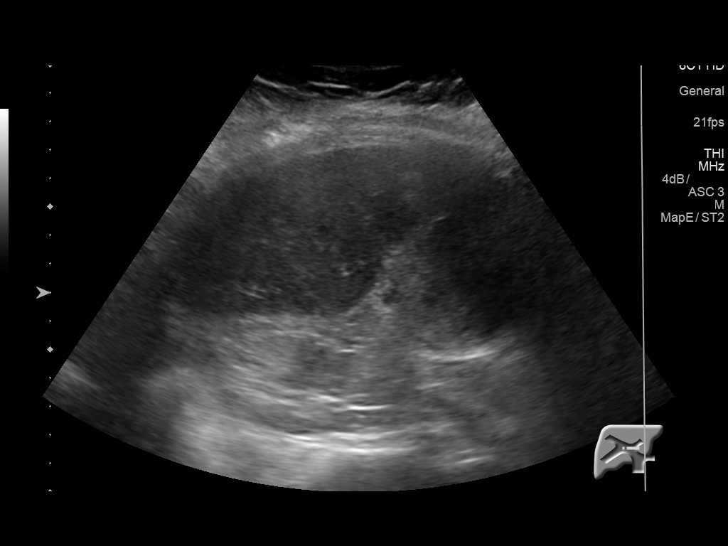
[im 59/79]
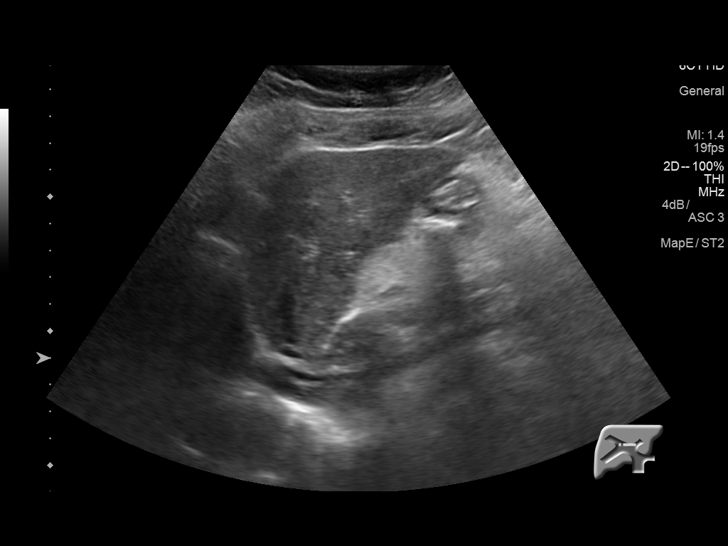
[im 66/79]
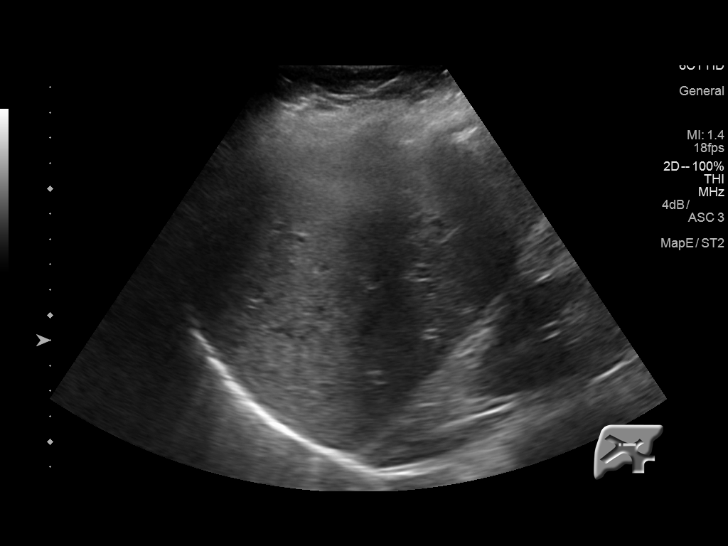
[im 72/79]
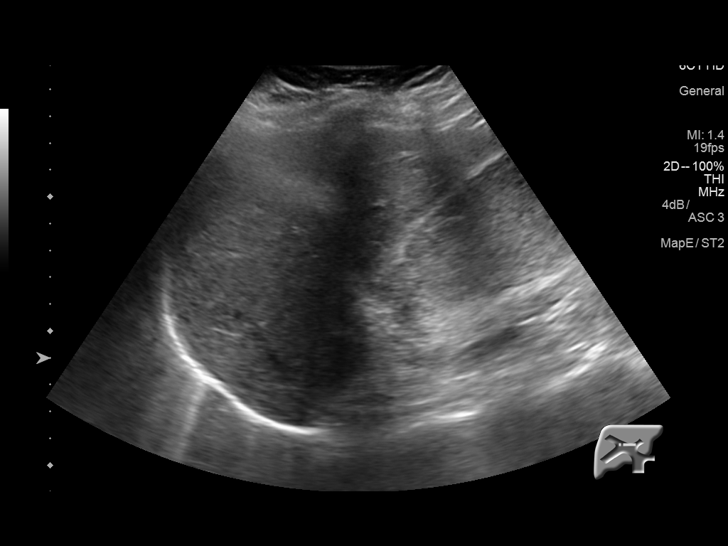
[im 79/79]
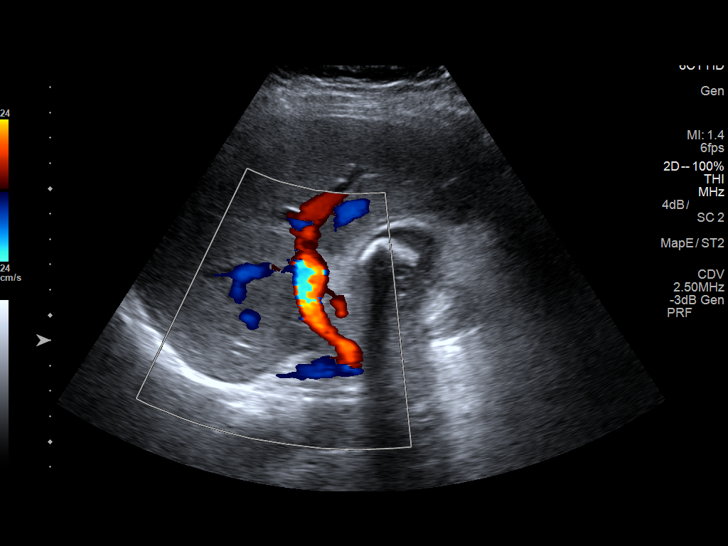

[14 of 25 positions shown; findings below may reference images not displayed]

FINDINGS: Gallbladder:

Within the gallbladder, there is sludge as well as echogenic foci
which move and shadow consistent with gallstones. The largest
gallstone measures 4.9 cm in length. There is gallbladder wall
thickening with edema. There is no frank pericholecystic fluid. No
sonographic Murphy sign noted.

Common bile duct:

Diameter: 5 mm. No intrahepatic or extrahepatic biliary duct
dilatation is appreciable.

Liver:

No focal lesion identified. Within normal limits in parenchymal
echogenicity. Liver measures 15 cm in length, upper normal.
IMPRESSION: Findings concerning for a degree of acute cholecystitis.

Liver upper normal in size with otherwise normal sonographic
appearance.

These results will be called to the ordering clinician or
representative by the Radiologist Assistant, and communication
documented in the PACS or zVision Dashboard.

## 2016-09-16 ENCOUNTER — Other Ambulatory Visit: Payer: Self-pay | Admitting: *Deleted

## 2016-09-16 NOTE — Telephone Encounter (Signed)
Received refill request from pharmacy for test strips and lancets. Lancets not on medication list, added for renewal. Please confirm directions and send.

## 2016-09-17 MED ORDER — GLUCOSE BLOOD VI STRP: ORAL_STRIP | 3 refills | Status: DC

## 2016-09-17 MED ORDER — ONETOUCH ULTRASOFT LANCETS MISC: 1.0000 | Freq: Every day | 3 refills | Status: DC

## 2016-09-17 NOTE — Telephone Encounter (Signed)
Sent. Thanks.   

## 2016-10-11 ENCOUNTER — Other Ambulatory Visit: Payer: Self-pay | Admitting: Family Medicine

## 2016-10-11 DIAGNOSIS — D509 Iron deficiency anemia, unspecified: Secondary | ICD-10-CM

## 2016-10-11 DIAGNOSIS — E7800 Pure hypercholesterolemia, unspecified: Secondary | ICD-10-CM

## 2016-10-11 DIAGNOSIS — E559 Vitamin D deficiency, unspecified: Secondary | ICD-10-CM

## 2016-10-20 ENCOUNTER — Other Ambulatory Visit: Payer: Self-pay | Admitting: Family Medicine

## 2016-10-20 ENCOUNTER — Other Ambulatory Visit (INDEPENDENT_AMBULATORY_CARE_PROVIDER_SITE_OTHER): Payer: BC Managed Care – PPO

## 2016-10-20 DIAGNOSIS — E559 Vitamin D deficiency, unspecified: Secondary | ICD-10-CM | POA: Diagnosis not present

## 2016-10-20 DIAGNOSIS — Z119 Encounter for screening for infectious and parasitic diseases, unspecified: Secondary | ICD-10-CM | POA: Diagnosis not present

## 2016-10-20 DIAGNOSIS — D509 Iron deficiency anemia, unspecified: Secondary | ICD-10-CM | POA: Diagnosis not present

## 2016-10-20 DIAGNOSIS — E7800 Pure hypercholesterolemia, unspecified: Secondary | ICD-10-CM

## 2016-10-20 LAB — CBC WITH DIFFERENTIAL/PLATELET
BASOS ABS: 0 10*3/uL (ref 0.0–0.1)
Basophils Relative: 0.6 % (ref 0.0–3.0)
EOS PCT: 5.8 % — AB (ref 0.0–5.0)
Eosinophils Absolute: 0.4 10*3/uL (ref 0.0–0.7)
HCT: 42.9 % (ref 36.0–46.0)
Hemoglobin: 14.4 g/dL (ref 12.0–15.0)
LYMPHS ABS: 1.3 10*3/uL (ref 0.7–4.0)
LYMPHS PCT: 21.1 % (ref 12.0–46.0)
MCHC: 33.5 g/dL (ref 30.0–36.0)
MCV: 85.8 fl (ref 78.0–100.0)
MONOS PCT: 6.9 % (ref 3.0–12.0)
Monocytes Absolute: 0.4 10*3/uL (ref 0.1–1.0)
NEUTROS ABS: 4 10*3/uL (ref 1.4–7.7)
NEUTROS PCT: 65.6 % (ref 43.0–77.0)
PLATELETS: 284 10*3/uL (ref 150.0–400.0)
RBC: 5.01 Mil/uL (ref 3.87–5.11)
RDW: 13.1 % (ref 11.5–15.5)
WBC: 6.1 10*3/uL (ref 4.0–10.5)

## 2016-10-20 LAB — COMPREHENSIVE METABOLIC PANEL
ALK PHOS: 81 U/L (ref 39–117)
ALT: 18 U/L (ref 0–35)
AST: 17 U/L (ref 0–37)
Albumin: 4.1 g/dL (ref 3.5–5.2)
BILIRUBIN TOTAL: 0.5 mg/dL (ref 0.2–1.2)
BUN: 13 mg/dL (ref 6–23)
CALCIUM: 10.1 mg/dL (ref 8.4–10.5)
CO2: 31 meq/L (ref 19–32)
Chloride: 105 mEq/L (ref 96–112)
Creatinine, Ser: 0.83 mg/dL (ref 0.40–1.20)
GFR: 75.36 mL/min (ref >60.00)
GLUCOSE: 139 mg/dL — AB (ref 70–99)
Potassium: 4.4 mEq/L (ref 3.5–5.1)
Sodium: 141 mEq/L (ref 135–145)
TOTAL PROTEIN: 7.4 g/dL (ref 6.0–8.3)

## 2016-10-20 LAB — LIPID PANEL
Cholesterol: 179 mg/dL (ref 0–200)
HDL: 45 mg/dL (ref >39.00)
LDL Cholesterol: 120 mg/dL — ABNORMAL HIGH (ref 0–99)
NonHDL: 134.2
TRIGLYCERIDES: 70 mg/dL (ref 0.0–149.0)
Total CHOL/HDL Ratio: 4
VLDL: 14 mg/dL (ref 0.0–40.0)

## 2016-10-20 LAB — IBC PANEL
Iron: 103 ug/dL (ref 42–145)
Saturation Ratios: 34.9 % (ref 20.0–50.0)
TRANSFERRIN: 211 mg/dL — AB (ref 212.0–360.0)

## 2016-10-20 LAB — VITAMIN D 25 HYDROXY (VIT D DEFICIENCY, FRACTURES): VITD: 44.63 ng/mL (ref 30.00–100.00)

## 2016-10-21 LAB — HEPATITIS C ANTIBODY: HCV AB: NONREACTIVE

## 2016-10-26 ENCOUNTER — Encounter: Payer: Self-pay | Admitting: Family Medicine

## 2016-10-26 ENCOUNTER — Ambulatory Visit (INDEPENDENT_AMBULATORY_CARE_PROVIDER_SITE_OTHER): Payer: BC Managed Care – PPO | Admitting: Family Medicine

## 2016-10-26 VITALS — BP 156/94 | HR 90 | Temp 98.9°F | Ht 60.0 in | Wt 163.5 lb

## 2016-10-26 DIAGNOSIS — D509 Iron deficiency anemia, unspecified: Secondary | ICD-10-CM

## 2016-10-26 DIAGNOSIS — K219 Gastro-esophageal reflux disease without esophagitis: Secondary | ICD-10-CM

## 2016-10-26 DIAGNOSIS — R739 Hyperglycemia, unspecified: Secondary | ICD-10-CM

## 2016-10-26 DIAGNOSIS — Z0000 Encounter for general adult medical examination without abnormal findings: Secondary | ICD-10-CM | POA: Diagnosis not present

## 2016-10-26 DIAGNOSIS — R030 Elevated blood-pressure reading, without diagnosis of hypertension: Secondary | ICD-10-CM | POA: Insufficient documentation

## 2016-10-26 MED ORDER — FERROUS SULFATE 325 (65 FE) MG PO TABS: 325.0000 mg | ORAL_TABLET | ORAL | Status: DC

## 2016-10-26 MED ORDER — TRIAMCINOLONE ACETONIDE 0.1 % EX CREA: 1.0000 "application " | TOPICAL_CREAM | Freq: Two times a day (BID) | CUTANEOUS | 0 refills | Status: DC | PRN

## 2016-10-26 MED ORDER — OMEPRAZOLE 20 MG PO CPDR: 20.0000 mg | DELAYED_RELEASE_CAPSULE | Freq: Every day | ORAL | 3 refills | Status: DC

## 2016-10-26 NOTE — Assessment & Plan Note (Signed)
Blood pressure stable on home check, update me as needed, if elevated at home.

## 2016-10-26 NOTE — Assessment & Plan Note (Signed)
Stable on Monday Wednesday Friday iron. Continue as is. She agrees. Labs discussed with patient.

## 2016-10-26 NOTE — Assessment & Plan Note (Signed)
Significant stressors noted. Continue work on diet and exercise. Continue weightbearing exercise, not smoking, with adequate vitamin D intake re: bone health related to PPI use.

## 2016-10-26 NOTE — Assessment & Plan Note (Signed)
Less than 125 on home check, continue work on diet and exercise and episodically check sugar at home. She agrees. Update me as needed, if worsening.

## 2016-10-26 NOTE — Assessment & Plan Note (Signed)
Tetanus 2006- she describes sig local injection site reaction. She doesn't have high risk exposures (ie young kids for tdap) PNA and shingles not due.  Flu shot not indicated due to prev reaction.  Mammogram and breast exam, per gyn. d/w pt.   Pap up to date. Per gyn.   D/w patient [id number] for colon cancer screening, including IFOB vs. Colonoscopy vs cologuard. Risks and benefits of both were discussed and patient voiced understanding. Pt elects for: cologuard.  Living will. D/w pt. Would have her husband designated.  Diet and exercise d/w pt. She is still working on weight and diet.  She is walking more.   Discussed. Gradual weight loss noted.   She has known gall stones but hasn't had sx in the meantime with diet adherence.  She wanted to put off elective treatment.   Labs d/w pt.  HIV testing prev neg during her pregnancy in 1994.   HCV neg.

## 2016-10-26 NOTE — Progress Notes (Signed)
CPE- See plan.  Routine anticipatory guidance given to patient.  See health maintenance.  The possibility exists that previously documented standard health maintenance information may have been brought forward from a previous encounter into this note.  If needed, that same information has been updated to reflect the current situation based on today's encounter.    Tetanus 2006- she describes sig local injection site reaction. She doesn't have high risk exposures (ie young kids for tdap) PNA and shingles not due.  Flu shot not indicated due to prev reaction.  Mammogram and breast exam, per gyn. d/w pt.   Pap up to date. Per gyn.   D/w patient UQ:[id number] for colon cancer screening, including IFOB vs. Colonoscopy vs cologuard. Risks and benefits of both were discussed and patient voiced understanding. Pt elects for: cologuard.  Living will. D/w pt. Would have her husband designated.  Diet and exercise d/w pt. She is still working on weight and diet.  She is walking more.   Discussed. Gradual weight loss noted.   She has known gall stones but hasn't had sx in the meantime with diet adherence.  She wanted to put off elective treatment.   Labs d/w pt.  HIV testing prev neg during her pregnancy in 1994.   HCV neg.     GERD.  Controlled with PPI.  She has tried to taper down. She attributed some of her sx to family stressors.  D/w pt.  She is working on diet and exercise.  No ADE on med.   Sugar elevation- had checked sugar at home.  She had eating a lot prior to labs.  Her other checks at home have been 120.    BP has been 100-120s/70s on home checks.  H/o white coat HTN.   Still on iron, taking it a few times a week.  She had been averaging about 3 doses per week.  D/w pt.  Iron level wnl.  D/w pt.     PMH and SH reviewed  Meds, vitals, and allergies reviewed.   ROS: Per HPI.  Unless specifically indicated otherwise in HPI, the patient denies:  General: fever. Eyes: acute  vision changes ENT: sore throat Cardiovascular: chest pain Respiratory: SOB GI: vomiting GU: dysuria Musculoskeletal: acute back pain Derm: acute rash Neuro: acute motor dysfunction Psych: worsening mood Endocrine: polydipsia Heme: bleeding Allergy: hayfever  GEN: nad, alert and oriented HEENT: mucous membranes moist NECK: supple w/o LA CV: rrr. PULM: ctab, no inc wob ABD: soft, +bs EXT: no edema SKIN: no acute rash

## 2016-10-26 NOTE — Patient Instructions (Signed)
Iron on MWF.  Update me as needed.  Take care.  Glad to see you.  Thanks for your effort.

## 2016-10-29 ENCOUNTER — Telehealth: Payer: Self-pay | Admitting: *Deleted

## 2016-10-29 NOTE — Telephone Encounter (Signed)
-----   Message from Annamarie MajorLugene S Xan Ingraham, [place] sent at 10/27/2016  9:53 AM EDT ----- Regarding: RE: needs cologuard Patient is checking with insurance for coverage and will let us know. ----- Message ----- From: Joaquim Namuncan, Graham S, MD Sent: 10/26/2016  11:16 AM To: Annamarie MajorLugene S Philmore Lepore, CMA Subject: needs cologuard

## 2016-10-30 ENCOUNTER — Telehealth: Payer: Self-pay | Admitting: *Deleted

## 2016-10-30 DIAGNOSIS — Z1211 Encounter for screening for malignant neoplasm of colon: Secondary | ICD-10-CM

## 2016-10-30 NOTE — Telephone Encounter (Signed)
Patient left a voicemail stating that she called her insurance company and was advised that they do not cover the cologuard. Patient wants to know what you recommend now?

## 2016-11-02 NOTE — Telephone Encounter (Signed)
Either IFOB or GI referral for colonoscopy.  I'll defer to her.   Let me know what she wants and I'll either put in the order for IFOB or the referral to GI.  Thanks.

## 2016-11-03 NOTE — Telephone Encounter (Signed)
Left message on voice mail  to call back

## 2016-11-04 ENCOUNTER — Telehealth: Payer: Self-pay | Admitting: Family Medicine

## 2016-11-04 NOTE — Telephone Encounter (Signed)
Please send her an IFOB.  If that isn't possible, then please have her pick it up.  Order placed.  Thanks.

## 2016-11-04 NOTE — Telephone Encounter (Signed)
Pt returned call, please call back at [phone]

## 2016-11-04 NOTE — Addendum Note (Signed)
Addended by: Joaquim NamUNCAN, Dyquan Minks S on: 11/04/2016 01:02 PM   Modules accepted: Orders

## 2016-11-04 NOTE — Telephone Encounter (Signed)
error 

## 2016-11-04 NOTE — Telephone Encounter (Signed)
Patient notified as instructed by telephone and verbalized understanding. Patient stated that she wants to do the IFOB and was advised that you would have them mailed to her. Patient requested a call back that they have been mailed to her or she needs to pick them up.

## 2016-11-20 ENCOUNTER — Other Ambulatory Visit (INDEPENDENT_AMBULATORY_CARE_PROVIDER_SITE_OTHER): Payer: BC Managed Care – PPO

## 2016-11-20 DIAGNOSIS — Z1211 Encounter for screening for malignant neoplasm of colon: Secondary | ICD-10-CM | POA: Diagnosis not present

## 2016-11-20 LAB — FECAL OCCULT BLOOD, IMMUNOCHEMICAL: FECAL OCCULT BLD: NEGATIVE

## 2017-02-12 ENCOUNTER — Telehealth: Payer: Self-pay | Admitting: Family Medicine

## 2017-02-12 NOTE — Telephone Encounter (Signed)
Copied from CRM [phone]. Topic: Quick Communication - See Telephone Encounter >> Feb 12, 2017  9:00 AM Sara Leonard, Rosey Batheresa D wrote: CRM for notification. See Telephone encounter for: 02/12/17. Patient called and said that she has jury duty in February and she feels like she can not do it. She said she is overwhelm with everything that she has going on with her father in law and son. She  even has had panic attacks herself and feels like she won't be able to sit in a room. She would like an excuse not for her so she won't have to go to it. Please call patient back, thanks.

## 2017-02-13 NOTE — Telephone Encounter (Signed)
Letter done, please make sure it printed.  Thanks.

## 2017-02-15 ENCOUNTER — Encounter: Payer: Self-pay | Admitting: *Deleted

## 2017-02-15 NOTE — Telephone Encounter (Signed)
Patient advised and asked that we put the letter in the mail to her.  Mailed.

## 2017-04-06 LAB — HM PAP SMEAR

## 2017-07-05 ENCOUNTER — Ambulatory Visit: Payer: BC Managed Care – PPO | Admitting: Family Medicine

## 2017-07-05 ENCOUNTER — Encounter: Payer: Self-pay | Admitting: Family Medicine

## 2017-07-05 DIAGNOSIS — M79606 Pain in leg, unspecified: Secondary | ICD-10-CM | POA: Diagnosis not present

## 2017-07-05 NOTE — Patient Instructions (Signed)
Possible bursitis.  Use ibuprofen and ice if needed You could have pulled a muscle.  You could have irritated your sciatic nerve.  Ibuprofen should help.   Gently stretch your back, single and double knee to chest.  Take care.  Glad to see you.

## 2017-07-05 NOTE — Progress Notes (Signed)
She was able to [place] her beach house and that was a big hassle for patient.  BP has been normal on home checks, d/w pt, usually systolic 110s-120s.   Sugar this AM was 121.    She had been moving and cleaning and working a lot- doing a lot of steps with the move.  She has L LFCN distribution numbness and burning and some burning that can radiate down past the L knee.  She is better than prev but not back to baseline.  She is taking advil as needed.  She isn't having back pain.  L thigh isn't ttp o/w.    No FCNAVD.  No other weakness. No numbness o/w.    Meds, vitals, and allergies reviewed.   ROS: Per HPI unless specifically indicated in ROS section   nad ncat rrr ctab abd soft L hip with normal ROM Back not ttp in midline, no cva pain.  Left greater trochanteric bursa tender to palpation.  She has mild change in sensation in the distribution of the left lateral femoral cutaneous nerve.  Straight leg raise is negative bilaterally.  Distally neurovascularly intact.  Able to bear weight.  No weakness on dorsiflexion or plantarflexion of the feet.

## 2017-07-05 NOTE — Assessment & Plan Note (Signed)
Likely multiple contributing issues. Possible bursitis.  Use ibuprofen and ice if needed She could have a mild quad strain.  She could have relatively mild sciatica symptoms. Relevant anatomy discussed with patient.  Reasonable to try stretching with interventions described above and update [place] as needed.  No need for imaging now. Meralgia paresthetica symptoms are not ominous and do not need separate intervention.

## 2017-09-29 ENCOUNTER — Ambulatory Visit: Payer: Self-pay | Admitting: *Deleted

## 2017-09-29 ENCOUNTER — Ambulatory Visit: Payer: BC Managed Care – PPO | Admitting: Nurse Practitioner

## 2017-09-29 ENCOUNTER — Ambulatory Visit (HOSPITAL_COMMUNITY)
Admission: EM | Admit: 2017-09-29 | Discharge: 2017-09-29 | Disposition: A | Discharge status: Home / Self Care | Payer: BC Managed Care – PPO | Attending: Urgent Care | Admitting: Urgent Care

## 2017-09-29 ENCOUNTER — Other Ambulatory Visit: Payer: Self-pay

## 2017-09-29 ENCOUNTER — Encounter (HOSPITAL_COMMUNITY): Payer: Self-pay | Admitting: Emergency Medicine

## 2017-09-29 DIAGNOSIS — M79645 Pain in left finger(s): Secondary | ICD-10-CM | POA: Insufficient documentation

## 2017-09-29 DIAGNOSIS — L03012 Cellulitis of left finger: Secondary | ICD-10-CM | POA: Diagnosis not present

## 2017-09-29 DIAGNOSIS — Z79899 Other long term (current) drug therapy: Secondary | ICD-10-CM | POA: Diagnosis not present

## 2017-09-29 DIAGNOSIS — F411 Generalized anxiety disorder: Secondary | ICD-10-CM | POA: Insufficient documentation

## 2017-09-29 DIAGNOSIS — K219 Gastro-esophageal reflux disease without esophagitis: Secondary | ICD-10-CM | POA: Insufficient documentation

## 2017-09-29 MED ORDER — LIDOCAINE HCL 2 % IJ SOLN
INTRAMUSCULAR | Status: AC
  Filled 2017-09-29: qty 20

## 2017-09-29 MED ORDER — SULFAMETHOXAZOLE-TRIMETHOPRIM 800-160 MG PO TABS: 1.0000 | ORAL_TABLET | Freq: Two times a day (BID) | ORAL | 0 refills | Status: DC

## 2017-09-29 NOTE — ED Provider Notes (Signed)
  MRN: [id number] DOB: [birth year]  Subjective:   Sara Leonard is a 57 y.o. female presenting for 1 week history of worsening left middle finger pain, swelling, redness. Patient reports that she chewed her hangnail off from said finger and has since had these symptoms. Denies fever, n/v, abdominal pain, red streaks, pain of her finger pad. Denies smoking cigarettes.  No current facility-administered medications for this encounter.   Current Outpatient Medications:  .  ferrous sulfate 325 (65 FE) MG tablet, Take 1 tablet (325 mg total) by mouth every Monday, Wednesday, and Friday., Disp: , Rfl:  .  glucose blood (ONETOUCH VERIO) test strip, Check blood sugar once daily and as directed. R73.9, Disp: 100 each, Rfl: 3 .  Lancets (ONETOUCH ULTRASOFT) lancets, 1 each by Other route daily. Use as instructed, Disp: 100 each, Rfl: 3 .  omeprazole (PRILOSEC) 20 MG capsule, Take 1-2 capsules (20-40 mg total) by mouth daily., Disp: 180 capsule, Rfl: 3 .  triamcinolone cream (KENALOG) 0.1 %, Apply 1 application topically 2 (two) times daily as needed., Disp: 453.6 g, Rfl: 0   Allergies  Allergen Reactions  . Penicillins     REACTION: rash  . Azithromycin   . Erythromycin Other (See Comments)    GI upset  . Influenza Vaccines     Local reaction, sig local erythema.    . Tdap [Tetanus-Diphth-Acell Pertussis] Other (See Comments)    Injection site reaction    Past Medical History:  Diagnosis Date  . Anxiety   . GERD (gastroesophageal reflux disease)   . Iron deficiency    long standing, + FH and predates onset of menses  . White coat hypertension      Past Surgical History:  Procedure Laterality Date  . CESAREAN SECTION     x2  . TONSILLECTOMY      Objective:   Vitals: BP (!) 159/98 (BP Location: Right Wrist)   Pulse 95   Temp 98.4 F (36.9 C) (Oral)   LMP 05/10/2011   SpO2 100%   Physical Exam  Constitutional: She is oriented to person, place, and time. She appears  well-developed and well-nourished.  Cardiovascular: Normal rate.  Pulmonary/Chest: Effort normal.  Musculoskeletal:       Hands: Neurological: She is alert and oriented to person, place, and time.  Psychiatric: Her mood appears anxious.   PROCEDURE NOTE: I&D of Abscess Verbal consent obtained. Digital block with 4cc of 2% lidocaine.  Cuticle along radial side of left middle finger lifted using Iris scissors with less than 0.5cc discharge of pus and serosanguinous fluid. Wound culture obtained. Cleansed and dressed.   Assessment and Plan :   Paronychia of finger of left hand  Finger pain, left  Anxiety state  I&D of paronychia performed successfully. Wound culture pending, start Bactrim. Schedule APAP and ibu. Wound care reviewed.   Wallis BambergMani, Mckenzee Beem, PA-C 09/29/17 1505

## 2017-09-29 NOTE — Telephone Encounter (Signed)
Advise UC if we don't have slots on schedule with another provider.  Thanks.

## 2017-09-29 NOTE — Telephone Encounter (Signed)
Patient notified as instructed by telephone and verbalized understanding. [facility]

## 2017-09-29 NOTE — Telephone Encounter (Signed)
Patient said she had a hang nail that she bite off instead of clipping off and now she thinks it is infected. She said that has been putting Bactraticis cream " first aid antitboitic " and alcohol. Patient is going out of town this afternoon and would like to know what else she could do to help with it.  Patient is a Producer, television/film/videohair dresser and she is having a hard time getting her finger to heal. She is going out of town and she is afraid to leave town without treatment.  Reason for Disposition . [1] No prior tetanus shots (or is not fully vaccinated) AND [2] any wound (e.g., cut, scrape)  Answer Assessment - Initial Assessment Questions 1. MECHANISM: "How did the injury happen?"      Hangnail- pulled into "quick"- cuticle  2. ONSET: "When did the injury happen?" (Minutes or hours ago)      Last week 3. LOCATION: "What part of the finger is injured?" "Is the nail damaged?"      Left middle finger- cuitcle 4. APPEARANCE of the INJURY: "What does the injury look like?"      Redness at cuticle 5. SEVERITY: "Can you use the hand normally?"  "Can you bend your fingers into a ball and then fully open them?"     Using normally- painful on the side 6. SIZE: For cuts, bruises, or swelling, ask: "How large is it?" (e.g., inches or centimeters;  entire finger)      Small area on side of nail 7. PAIN: "Is there pain?" If so, ask: "How bad is the pain?"    (e.g., Scale 1-10; or mild, moderate, severe)     6-7- when touched 8. TETANUS: For any breaks in the skin, ask: "When was the last tetanus booster?"     Patient is allergic to it 9. OTHER SYMPTOMS: "Do you have any other symptoms?"     no 10. PREGNANCY: "Is there any chance you are pregnant?" "When was your last menstrual period?"       No-n/a  Protocols used: FINGER INJURY-A-AH

## 2017-09-29 NOTE — Discharge Instructions (Signed)
You may take 500mg  Tylenol with ibuprofen 400-600mg  every 6 hours for pain and inflammation. Remove your dressing in 24 hours. Keep your wound covered over the next week if you are going to expose it to dirty environments. If you develop fever, worsening pain/swelling/redness, nausea with vomiting, red streaks along your hand then please report to an ER or urgent care as these are signs and symptoms that your infection is worsening.

## 2017-09-29 NOTE — ED Triage Notes (Signed)
Pt had a hang nail that tore about one week ago to the left middle finger.  She has had increased swelling, redness and pain since that time.

## 2017-09-30 ENCOUNTER — Ambulatory Visit: Payer: Self-pay | Admitting: Family Medicine

## 2017-09-30 NOTE — Telephone Encounter (Signed)
I returned her call.   She is out of town and concerned about what care she should be doing post drainage of the finger yesterday at Urgent Care.    See triage notes.  I went over home care advice with her.   To wash it gently with a mild soap and warm water.    Keep it dry.   If she is out it would be ok to gently wrap it in a gauze to protect it from germs and dirt.   I answered her many questions to her satisfaction.   She thanked me for my assistance.  I told her the s/s of infection to watch for.   She verbalized understanding.    Reason for Disposition . Skin tape (e.g., Steri-strips) removal, questions about    No stitches.  Gauze drsg removed today by pt.  Pus [facility].   Not given post "op" care instructions.  Answer Assessment - Initial Assessment Questions 1. SYMPTOM: "What's the main symptom you're concerned about?" (e.g., redness, pain, drainage)     [facility] and had my finger drained due to infection.   They did not give me directions for care of it.   Should I do anything with it?   I've removed the drsg today.  I'm out of town and just concerned that I'm doing the right thing. 2. ONSET: "When did  start?"     Yesterday they drained pus from my cuticle after numbing me.   They gave me Bactrim to take. 3. SURGERY: "What surgery was performed?"     Yes.   Finger was drained.    My finger is still hurting.   No fever.   I'm taking my antibiotic.   4. DATE of SURGERY: "When was surgery performed?"      Yesterday.   Drained of small amount of pus next to my cuticle.    5. INCISION SITE: "Where is the incision located?"      My finger by the cuticle.   6. REDNESS: "Is there any redness at the incision site?" If yes, ask: "How wide across is the redness?" (Inches, centimeters)      Yes a little redness and swelling with bruising.   7. PAIN: "Is there any pain?" If so, ask: "How bad is it?"  (Scale 1-10; or  mild, moderate, severe)     Yes.  It's still throbbing.    8. BLEEDING: "Is there any bleeding?" If so, ask: "How much?" and "Where?"     No and no drainage. 9. DRAINAGE: "Is there any drainage from the incision site?" If yes, ask: "What color and how much?" (e.g., red, cloudy, pus; drops, teaspoon)     They drained it yesterday.  No drainage today.  10. FEVER: "Do you have a fever?" If so, ask: "What is your temperature, how was it measured, and when did it start?"       No  I've been checking it. 11. OTHER SYMPTOMS: "Do you have any other symptoms?" (e.g., shaking chills, weakness, rash elsewhere on body)       None of the above.  Protocols used: POST-OP INCISION [facility]-A-AH

## 2017-10-01 ENCOUNTER — Telehealth (HOSPITAL_COMMUNITY): Payer: Self-pay

## 2017-10-01 LAB — AEROBIC CULTURE  (SUPERFICIAL SPECIMEN)

## 2017-10-01 LAB — AEROBIC CULTURE W GRAM STAIN (SUPERFICIAL SPECIMEN): Gram Stain: NONE SEEN

## 2017-10-01 NOTE — Telephone Encounter (Signed)
Culture positive for MRSA sensitive to [facility]. Pt denies any symptoms of infection at this time and states the area does not look any worse than it did. Pt given instructions on signs and symptoms of infection such as redness, purulent drainage, increased swelling and fevers, and educated to be seen for any of the above.

## 2017-10-04 ENCOUNTER — Encounter: Payer: Self-pay | Admitting: Internal Medicine

## 2017-10-04 ENCOUNTER — Ambulatory Visit: Payer: BC Managed Care – PPO | Admitting: Internal Medicine

## 2017-10-04 VITALS — BP 134/82 | HR 96 | Temp 98.2°F | Wt 159.0 lb

## 2017-10-04 DIAGNOSIS — L03012 Cellulitis of left finger: Secondary | ICD-10-CM

## 2017-10-04 NOTE — Progress Notes (Signed)
Subjective:    Patient ID: Sara MccreedySherry D Leonard, female    DOB: Apr 26, 1960, 57 y.o.   MRN: [id number]  HPI  Pt presents to the clinic today for UC Follow up. [facility] with c/o 1 week history of finger pain and swelling. They performed a digital block and an I&D of the left middle finger. Wound culture was positive for MRSA. She was placed on Septra. She was advised to take Ibuprofen and Tylenol as needed. Since her UC visit, she reports improvement in pain, swelling and redness. She denies fever, chills or body aches.  Review of Systems      Past Medical History:  Diagnosis Date  . Anxiety   . GERD (gastroesophageal reflux disease)   . Iron deficiency    long standing, + FH and predates onset of menses  . White coat hypertension     Current Outpatient Medications  Medication Sig Dispense Refill  . ferrous sulfate 325 (65 FE) MG tablet Take 1 tablet (325 mg total) by mouth every Monday, Wednesday, and Friday.    Marland Kitchen. glucose blood (ONETOUCH VERIO) test strip Check blood sugar once daily and as directed. R73.9 100 each 3  . Lancets (ONETOUCH ULTRASOFT) lancets 1 each by Other route daily. Use as instructed 100 each 3  . omeprazole (PRILOSEC) 20 MG capsule Take 1-2 capsules (20-40 mg total) by mouth daily. 180 capsule 3  . sulfamethoxazole-trimethoprim (BACTRIM DS,SEPTRA DS) 800-160 MG tablet Take 1 tablet by mouth 2 (two) times daily.    Marland Kitchen. triamcinolone cream (KENALOG) 0.1 % Apply 1 application topically 2 (two) times daily as needed. 453.6 g 0   No current facility-administered medications for this visit.     Allergies  Allergen Reactions  . Penicillins     REACTION: rash  . Azithromycin   . Erythromycin Other (See Comments)    GI upset  . Influenza Vaccines     Local reaction, sig local erythema.    . Tdap [Tetanus-Diphth-Acell Pertussis] Other (See Comments)    Injection site reaction    Family History  Problem Relation Age of Onset  . Diabetes Mother   .  Hypertension Mother   . Coronary artery disease Father   . Heart attack Father   . Hypertension Father   . Hyperlipidemia Father   . Kidney disease Father   . Diabetes Father   . Colon cancer Neg Hx   . Breast cancer Neg Hx     Social History   Socioeconomic History  . Marital status: Married    Spouse name: Not on file  . Number of children: 2  . Years of education: Not on file  . Highest education level: Not on file  Occupational History  . Occupation: Event organiserhair dresser    Employer: SELF-EMPLOYED  Social Needs  . Financial resource strain: Not on file  . Food insecurity:    Worry: Not on file    Inability: Not on file  . Transportation needs:    Medical: Not on file    Non-medical: Not on file  Tobacco Use  . Smoking status: Never Smoker  . Smokeless tobacco: Never Used  Substance and Sexual Activity  . Alcohol use: No    Alcohol/week: 0.0 oz  . Drug use: No  . Sexual activity: Not on file  Lifestyle  . Physical activity:    Days per week: Not on file    Minutes per session: Not on file  . Stress: Not on  file  Relationships  . Social connections:    Talks on phone: Not on file    Gets together: Not on file    Attends religious service: Not on file    Active member of club or organization: Not on file    Attends meetings of clubs or organizations: Not on file    Relationship status: Not on file  . Intimate partner violence:    Fear of current or ex partner: Not on file    Emotionally abused: Not on file    Physically abused: Not on file    Forced sexual activity: Not on file  Other Topics Concern  . Not on file  Social History Narrative   Married 1988   2 sons   hairdresser     Constitutional: Denies fever, malaise, fatigue, headache or abrupt weight changes.  Skin: Pt reports swelling and pain of left middle finger. Denies rashes, lesions or ulcercations.    No other specific complaints in a complete review of systems (except as listed in HPI  above).  Objective:   Physical Exam   BP 134/82   Pulse 96   Temp 98.2 F (36.8 C) (Oral)   Wt 159 lb (72.1 kg)   LMP 05/10/2011   SpO2 99%   BMI 31.05 kg/m  Wt Readings from Last 3 Encounters:  10/04/17 159 lb (72.1 kg)  07/05/17 160 lb 8 oz (72.8 kg)  10/26/16 163 lb 8 oz (74.2 kg)    General: Appears her stated age, well developed, well nourished in NAD. Skin: Healing paronychia noted of left middle finger. No redness noted. Musculoskeletal: Normal flexion and extension of the left middle finger.   BMET    Component Value Date/Time   NA 141 10/20/2016 0824   K 4.4 10/20/2016 0824   CL 105 10/20/2016 0824   CO2 31 10/20/2016 0824   GLUCOSE 139 (H) 10/20/2016 0824   BUN 13 10/20/2016 0824   CREATININE 0.83 10/20/2016 0824   CALCIUM 10.1 10/20/2016 0824   GFRNONAA 81.11 09/25/2008 1120   GFRAA 99 07/12/2007 0857    Lipid Panel     Component Value Date/Time   CHOL 179 10/20/2016 0824   TRIG 70.0 10/20/2016 0824   HDL 45.00 10/20/2016 0824   CHOLHDL 4 10/20/2016 0824   VLDL 14.0 10/20/2016 0824   LDLCALC 120 (H) 10/20/2016 0824    CBC    Component Value Date/Time   WBC 6.1 10/20/2016 0824   RBC 5.01 10/20/2016 0824   HGB 14.4 10/20/2016 0824   HCT 42.9 10/20/2016 0824   PLT 284.0 10/20/2016 0824   MCV 85.8 10/20/2016 0824   MCHC 33.5 10/20/2016 0824   RDW 13.1 10/20/2016 0824   LYMPHSABS 1.3 10/20/2016 0824   MONOABS 0.4 10/20/2016 0824   EOSABS 0.4 10/20/2016 0824   BASOSABS 0.0 10/20/2016 0824    Hgb A1C Lab Results  Component Value Date   HGBA1C 5.7 02/01/2012           Assessment & Plan:   UC Follow Up for Paronychia of Left Middle Finger, MRSA:  UC notes and labs reviewed A small bit of pus was expressed with manual expression Area covered with TAB and bandaid Continue Septra until finished Try Epsom Salt soak daily  Return precautions discussed Nicki Reaper, NP

## 2017-10-04 NOTE — Patient Instructions (Signed)
Paronychia  Paronychia is an infection of the skin. It happens near a fingernail or toenail. It may cause pain and swelling around the nail. Usually, it is not serious and it clears up with treatment.  Follow these instructions at home:   Soak the fingers or toes in warm water as told by your doctor. You may be told to do this for 20 minutes, 2-3 times a day.   Keep the area dry when you are not soaking it.   Take medicines only as told by your doctor.   If you were given an antibiotic medicine, finish all of it even if you start to feel better.   Keep the affected area clean.   Do not try to drain a fluid-filled bump yourself.   Wear rubber gloves when putting your hands in water.   Wear gloves if your hands might touch cleaners or chemicals.   Follow your doctor's instructions about:  ? Wound care.  ? Bandage (dressing) changes and removal.  Contact a doctor if:   Your symptoms get worse or do not improve.   You have a fever or chills.   You have redness spreading from the affected area.   You have more fluid, blood, or pus coming from the affected area.   Your finger or knuckle is swollen or is hard to move.  This information is not intended to replace advice given to you by your health care provider. Make sure you discuss any questions you have with your health care provider.  Document Released: 02/04/2009 Document Revised: 07/25/2015 Document Reviewed: 01/24/2014  Elsevier Interactive Patient Education  2018 [employer]

## 2017-10-11 ENCOUNTER — Encounter (INDEPENDENT_AMBULATORY_CARE_PROVIDER_SITE_OTHER): Payer: Self-pay

## 2017-10-11 ENCOUNTER — Encounter: Payer: Self-pay | Admitting: Family Medicine

## 2017-10-11 ENCOUNTER — Ambulatory Visit: Payer: BC Managed Care – PPO | Admitting: Family Medicine

## 2017-10-11 DIAGNOSIS — L03019 Cellulitis of unspecified finger: Secondary | ICD-10-CM | POA: Diagnosis not present

## 2017-10-11 MED ORDER — SULFAMETHOXAZOLE-TRIMETHOPRIM 800-160 MG PO TABS: 1.0000 | ORAL_TABLET | Freq: Two times a day (BID) | ORAL | 0 refills | Status: DC

## 2017-10-11 NOTE — Progress Notes (Signed)
She pulled a hangnail on the L 3rd finger prev.  Locally red painful and puffy thereafter.  She was able to work the next day but with sig pain.  She had local I&D with MRSA noted.   Had been draining some in the meantime.  She had been soaking it in the meantime.  She had to be out of work after the I&D.  She is going to try to go back to work later this week.  She finished septra recently.  No fevers.  Minimally sore now but way better than prev.  Redness is resolved.  Not hot locally.   Meds, vitals, and allergies reviewed.   ROS: Per HPI unless specifically indicated in ROS section   nad L distal 3rd finger with resolving irritation/minimal bruising, no fluctuant mass. No paronychia.  No local heat or swelling, no redness.  Normal hand/finger ROM.  No drainage.

## 2017-10-11 NOTE — Patient Instructions (Signed)
Hold the antibiotics for now.  Restart and call the clinic if more pain, redness, or fever.  Keep covered in the meantime.  Doesn't look infected now.  Local irritation should resolve.  Okay to keep soaking as needed in the meantime.   Take care.  Glad to see you.

## 2017-10-12 DIAGNOSIS — L03019 Cellulitis of unspecified finger: Secondary | ICD-10-CM | POA: Insufficient documentation

## 2017-10-12 NOTE — Assessment & Plan Note (Signed)
Clearly improved, based on the way she described the lesion previously looking.  No drainage.  Minimal resolving irritation.  This should continue to improve.  Discussed with patient about options.  Area covered with a Band-Aid just for protection and cleanliness.  She was worried about previous MRSA infection.  Discussed options.  She took a picture of her hand today for comparison in the future.  If any worsening then restart Septra.  Prescription given to patient to hold in the meantime.  Update me as needed otherwise.  I expect her not to need antibiotics.  Okay for outpatient follow-up.

## 2017-10-15 ENCOUNTER — Other Ambulatory Visit: Payer: Self-pay | Admitting: Family Medicine

## 2017-10-15 ENCOUNTER — Other Ambulatory Visit (INDEPENDENT_AMBULATORY_CARE_PROVIDER_SITE_OTHER): Payer: BC Managed Care – PPO

## 2017-10-15 DIAGNOSIS — E7800 Pure hypercholesterolemia, unspecified: Secondary | ICD-10-CM

## 2017-10-15 DIAGNOSIS — R739 Hyperglycemia, unspecified: Secondary | ICD-10-CM

## 2017-10-15 DIAGNOSIS — E559 Vitamin D deficiency, unspecified: Secondary | ICD-10-CM | POA: Diagnosis not present

## 2017-10-15 DIAGNOSIS — D509 Iron deficiency anemia, unspecified: Secondary | ICD-10-CM | POA: Diagnosis not present

## 2017-10-15 LAB — COMPREHENSIVE METABOLIC PANEL
ALBUMIN: 4.3 g/dL (ref 3.5–5.2)
ALK PHOS: 68 U/L (ref 39–117)
ALT: 22 U/L (ref 0–35)
AST: 20 U/L (ref 0–37)
BUN: 18 mg/dL (ref 6–23)
CHLORIDE: 105 meq/L (ref 96–112)
CO2: 30 mEq/L (ref 19–32)
Calcium: 9.9 mg/dL (ref 8.4–10.5)
Creatinine, Ser: 0.82 mg/dL (ref 0.40–1.20)
GFR: 76.15 mL/min (ref >60.00)
Glucose, Bld: 126 mg/dL — ABNORMAL HIGH (ref 70–99)
POTASSIUM: 3.8 meq/L (ref 3.5–5.1)
Sodium: 141 mEq/L (ref 135–145)
TOTAL PROTEIN: 7.1 g/dL (ref 6.0–8.3)
Total Bilirubin: 0.6 mg/dL (ref 0.2–1.2)

## 2017-10-15 LAB — CBC WITH DIFFERENTIAL/PLATELET
BASOS ABS: 0 10*3/uL (ref 0.0–0.1)
Basophils Relative: 0.5 % (ref 0.0–3.0)
EOS PCT: 4 % (ref 0.0–5.0)
Eosinophils Absolute: 0.2 10*3/uL (ref 0.0–0.7)
HEMATOCRIT: 38.8 % (ref 36.0–46.0)
HEMOGLOBIN: 13.3 g/dL (ref 12.0–15.0)
LYMPHS ABS: 2.2 10*3/uL (ref 0.7–4.0)
LYMPHS PCT: 49.3 % — AB (ref 12.0–46.0)
MCHC: 34.3 g/dL (ref 30.0–36.0)
MCV: 86.2 fl (ref 78.0–100.0)
MONOS PCT: 6.3 % (ref 3.0–12.0)
Monocytes Absolute: 0.3 10*3/uL (ref 0.1–1.0)
NEUTROS PCT: 39.9 % — AB (ref 43.0–77.0)
Neutro Abs: 1.8 10*3/uL (ref 1.4–7.7)
Platelets: 234 10*3/uL (ref 150.0–400.0)
RBC: 4.51 Mil/uL (ref 3.87–5.11)
RDW: 13.3 % (ref 11.5–15.5)
WBC: 4.4 10*3/uL (ref 4.0–10.5)

## 2017-10-15 LAB — LIPID PANEL
CHOL/HDL RATIO: 4
CHOLESTEROL: 192 mg/dL (ref 0–200)
HDL: 46.2 mg/dL (ref >39.00)
LDL CALC: 135 mg/dL — AB (ref 0–99)
NonHDL: 145.84
TRIGLYCERIDES: 55 mg/dL (ref 0.0–149.0)
VLDL: 11 mg/dL (ref 0.0–40.0)

## 2017-10-15 LAB — IBC PANEL
Iron: 112 ug/dL (ref 42–145)
SATURATION RATIOS: 40.2 % (ref 20.0–50.0)
TRANSFERRIN: 199 mg/dL — AB (ref 212.0–360.0)

## 2017-10-15 LAB — VITAMIN D 25 HYDROXY (VIT D DEFICIENCY, FRACTURES): VITD: 42.63 ng/mL (ref 30.00–100.00)

## 2017-10-15 LAB — HEMOGLOBIN A1C: Hgb A1c MFr Bld: 6.3 % (ref 4.6–6.5)

## 2017-10-22 ENCOUNTER — Other Ambulatory Visit: Payer: BC Managed Care – PPO

## 2017-10-29 ENCOUNTER — Ambulatory Visit (INDEPENDENT_AMBULATORY_CARE_PROVIDER_SITE_OTHER): Payer: BC Managed Care – PPO | Admitting: Family Medicine

## 2017-10-29 ENCOUNTER — Encounter: Payer: Self-pay | Admitting: Family Medicine

## 2017-10-29 VITALS — BP 140/86 | HR 93 | Temp 98.0°F | Ht 59.25 in | Wt 159.8 lb

## 2017-10-29 DIAGNOSIS — Z1211 Encounter for screening for malignant neoplasm of colon: Secondary | ICD-10-CM

## 2017-10-29 DIAGNOSIS — Z7189 Other specified counseling: Secondary | ICD-10-CM

## 2017-10-29 DIAGNOSIS — Z0000 Encounter for general adult medical examination without abnormal findings: Secondary | ICD-10-CM

## 2017-10-29 DIAGNOSIS — R739 Hyperglycemia, unspecified: Secondary | ICD-10-CM

## 2017-10-29 DIAGNOSIS — K8020 Calculus of gallbladder without cholecystitis without obstruction: Secondary | ICD-10-CM

## 2017-10-29 DIAGNOSIS — F411 Generalized anxiety disorder: Secondary | ICD-10-CM

## 2017-10-29 DIAGNOSIS — K219 Gastro-esophageal reflux disease without esophagitis: Secondary | ICD-10-CM

## 2017-10-29 DIAGNOSIS — D509 Iron deficiency anemia, unspecified: Secondary | ICD-10-CM

## 2017-10-29 MED ORDER — GLUCOSE BLOOD VI STRP: ORAL_STRIP | 3 refills | Status: DC

## 2017-10-29 MED ORDER — OMEPRAZOLE 20 MG PO CPDR: 20.0000 mg | DELAYED_RELEASE_CAPSULE | Freq: Every day | ORAL | 3 refills | Status: DC

## 2017-10-29 NOTE — Progress Notes (Signed)
CPE- See plan.  Routine anticipatory guidance given to patient.  See health maintenance.  The possibility exists that previously documented standard health maintenance information may have been brought forward from a previous encounter into this note.  If needed, that same information has been updated to reflect the current situation based on today's encounter.    Tetanus 2006- she describes sig local injection site reaction. She doesn't have high risk exposures (ie young kids for tdap) PNA and shingles not due.  Flu shot not indicated due to prev reaction.   Mammogram and breast exam, per gyn. d/w pt.   Pap per gyn. D/w pt.  I'll defer.   .D/w patient [id number]:options for colon cancer screening, including IFOB vs. colonoscopy.  Risks and benefits of both were discussed and patient voiced understanding.  Pt elects [id number]:IFOB.   Living will. D/w pt. Would have her husband designated.  Diet and exercise d/w pt. She is still working on weight and diet.  She is walking more.   HIV testing prev neg during her pregnancy in 1994.  HCV neg prev.  She has known gall stones but hasn't had sx in the meantime with diet adherence. She wanted to put off elective treatment.    Discussed with patient about routine cautions.  Labs d/w pt. Not diabetic by A1c.    Still on iron MWF when she doesn't forget to take it.  Iron level is reasonable.  Not anemic now.   Still with GERD sx, likely exacerbated by stress.  She tries to limit med use, used prn.  PPI helps, when needed/used.   Her L 3rd finger trouble is resolved except for minimal residual soreness locally, as expected.    Family stressors d/w pt.  She is trying to manage her concerns.    PMH and SH reviewed  Meds, vitals, and allergies reviewed.   ROS: Per HPI.  Unless specifically indicated otherwise in HPI, the patient denies:  General: fever. Eyes: acute vision changes ENT: sore throat Cardiovascular: chest pain Respiratory: SOB GI:  vomiting GU: dysuria Musculoskeletal: acute back pain Derm: acute rash Neuro: acute motor dysfunction Psych: worsening mood Endocrine: polydipsia Heme: bleeding Allergy: hayfever  GEN: nad, alert and oriented HEENT: mucous membranes moist NECK: supple w/o LA CV: rrr. PULM: ctab, no inc wob ABD: soft, +bs EXT: no edema SKIN: no acute rash Left third finger with normal inspection and normal capillary refill.  No sign of abscess or infection.

## 2017-10-29 NOTE — Patient Instructions (Signed)
Go to the lab on the way out.  We'll contact you with your lab report. Don't change your meds for now.  Keep working diet and exercise as best you can.  Update me as needed.  Take care.  Glad to see you.

## 2017-11-01 NOTE — Assessment & Plan Note (Signed)
Likely exacerbated by stress.  She has tried to limit PPI use.  It does work when she takes it.  When she is not having symptoms she does not take the medication.

## 2017-11-01 NOTE — Assessment & Plan Note (Signed)
She wanted to defer cholecystectomy for now.  She has no symptoms with a low-fat diet.  Routine cautions given.  If she has any right upper quadrant pain that would prompt evaluation.

## 2017-11-01 NOTE — Assessment & Plan Note (Signed)
Long-standing.  Continue iron.  Iron level is reasonable.  Not anemic.  Follow-up pending.

## 2017-11-01 NOTE — Assessment & Plan Note (Signed)
She has history of anxiety with significant family stressors.  Discussed with her about trying to manage those.  She will update me as needed.  Still okay for outpatient follow-up.

## 2017-11-01 NOTE — Assessment & Plan Note (Addendum)
Not diabetic by A1c.  Discussed with patient about labs.  Continue work on diet and exercise.

## 2017-11-01 NOTE — Assessment & Plan Note (Signed)
Tetanus 2006- she describes sig local injection site reaction. She doesn't have high risk exposures (ie young kids for tdap) PNA and shingles not due.  Flu shot not indicated due to prev reaction.   Mammogram and breast exam, per gyn. d/w pt.   Pap per gyn. D/w pt.  I'll defer.   .D/w patient [id number] for colon cancer screening, including IFOB vs. colonoscopy.  Risks and benefits of both were discussed and patient voiced understanding.  Pt elects HCW:CBJS.   Living will. D/w pt. Would have her husband designated if patient were incapacitated. Diet and exercise d/w pt. She is still working on weight and diet.  She is walking more.

## 2017-11-01 NOTE — Assessment & Plan Note (Signed)
Living will. D/w pt. Would have her husband designated if patient were incapacitated.   

## 2017-11-09 ENCOUNTER — Other Ambulatory Visit (INDEPENDENT_AMBULATORY_CARE_PROVIDER_SITE_OTHER): Payer: BC Managed Care – PPO

## 2017-11-09 DIAGNOSIS — Z1211 Encounter for screening for malignant neoplasm of colon: Secondary | ICD-10-CM

## 2017-11-09 LAB — FECAL OCCULT BLOOD, IMMUNOCHEMICAL: Fecal Occult Bld: NEGATIVE

## 2018-04-27 NOTE — Progress Notes (Signed)
Dr. Karleen Hampshire T. Natassja Ollis, MD, CAQ Sports [facility] [address] [place], [address] Phone: [phone] Fax: [phone]  04/28/2018  Patient: Sara Leonard, MRN: [id number], DOB: [birth year], 58 y.o.  Primary Physician:  Joaquim Nam, MD   Chief Complaint  Patient presents with  . Foot Pain    Left heel    Subjective:   This 58 y.o. female patient presents with a 3 week long history of L heel pain. This is notable for worsening pain first thing in the morning when arising and standing after sitting.   Very nice lady, she presents today with ongoing heel pain. On her feet a lot and sore a lot at the heel. When waking up in the morning, it will hurt a lot.   No accident or trauma.   Prior foot or ankle fractures: none Prior operations: none Orthotics or bracing: none Medications: OTC ibuprofen and tylenol PT or home rehab: none Night splints: no Ice massage: no Ball massage: no  Metatarsal pain: no  The PMH, PSH, Social History, Family History, Medications, and allergies have been reviewed in [facility], and have been updated if relevant.  Patient Active Problem List   Diagnosis Date Noted  . White coat syndrome without diagnosis of hypertension 10/26/2016  . Hives 08/05/2015  . Calculus of gallbladder without cholecystitis without obstruction 02/16/2015  . Hepatomegaly 02/12/2015  . Advance care planning 05/22/2014  . Hyperglycemia 05/22/2014  . Vitamin D deficiency 05/22/2014  . Knee internal derangement 10/13/2010  . HYPERCHOLESTEROLEMIA 09/26/2008  . ANEMIA, IRON DEFICIENCY 09/26/2008  . Anxiety state 09/26/2008  . GERD 09/26/2008    Past Medical History:  Diagnosis Date  . Anxiety   . GERD (gastroesophageal reflux disease)   . Iron deficiency    long standing, + FH and predates onset of menses  . White coat hypertension     Past Surgical History:  Procedure Laterality Date  . CESAREAN SECTION     x2  .  TONSILLECTOMY      Social History   Socioeconomic History  . Marital status: Married    Spouse name: Not on file  . Number of children: 2  . Years of education: Not on file  . Highest education level: Not on file  Occupational History  . Occupation: Event organiser: SELF-EMPLOYED  Social Needs  . Financial resource strain: Not on file  . Food insecurity:    Worry: Not on file    Inability: Not on file  . Transportation needs:    Medical: Not on file    Non-medical: Not on file  Tobacco Use  . Smoking status: Never Smoker  . Smokeless tobacco: Never Used  Substance and Sexual Activity  . Alcohol use: No    Alcohol/week: 0.0 standard drinks  . Drug use: No  . Sexual activity: Not on file  Lifestyle  . Physical activity:    Days per week: Not on file    Minutes per session: Not on file  . Stress: Not on file  Relationships  . Social connections:    Talks on phone: Not on file    Gets together: Not on file    Attends religious service: Not on file    Active member of club or organization: Not on file    Attends meetings of clubs or organizations: Not on file    Relationship status: Not on file  . Intimate partner violence:  Fear of current or ex partner: Not on file    Emotionally abused: Not on file    Physically abused: Not on file    Forced sexual activity: Not on file  Other Topics Concern  . Not on file  Social History Narrative   Married 1988   2 sons   hairdresser    Family History  Problem Relation Age of Onset  . Diabetes Mother   . Hypertension Mother   . Coronary artery disease Father   . Heart attack Father   . Hypertension Father   . Hyperlipidemia Father   . Kidney disease Father   . Diabetes Father   . Colon cancer Neg Hx   . Breast cancer Neg Hx     Allergies  Allergen Reactions  . Penicillins     REACTION: rash  . Azithromycin   . Erythromycin Other (See Comments)    GI upset  . Influenza Vaccines     Local  reaction, sig local erythema.    . Tdap [Tetanus-Diphth-Acell Pertussis] Other (See Comments)    Injection site reaction    Medication list reviewed and updated in full in [place] [facility].  GEN: No fevers, chills. Nontoxic. Primarily MSK c/o today. MSK: Detailed in the HPI GI: tolerating PO intake without difficulty Neuro: No numbness, parasthesias, or tingling associated. Otherwise the pertinent positives of the ROS are noted above.   Objective:   Blood pressure 130/82, pulse 87, temperature 97.9 F (36.6 C), temperature source Oral, height 4' 11.25" (1.505 m), weight 164 lb 8 oz (74.6 kg), last menstrual period 05/10/2011, SpO2 99 %.  GEN: Well-developed,well-nourished,in no acute distress; alert,appropriate and cooperative throughout examination HEENT: Normocephalic and atraumatic without obvious abnormalities. Ears, externally no deformities PULM: Breathing comfortably in no respiratory distress EXT: No clubbing, cyanosis, or edema PSYCH: Normally interactive. Cooperative during the interview. Pleasant. Friendly and conversant. Not anxious or depressed appearing. Normal, full affect.  L heel Echymosis: no Edema: no ROM: full LE B Gait: heel toe, non-antalgic MT pain: no Callus pattern: none Lateral Mall: NT Medial Mall: NT Talus: NT Navicular: NT Calcaneous: NT Metatarsals: NT 5th MT: NT Phalanges: NT Achilles: NT Plantar Fascia: tender, medial along PF. Pain with forced dorsi Fat Pad: NT Peroneals: NT Post Tib: NT Great Toe: Nml motion Ant Drawer: neg Other foot breakdown: none Long arch: preserved Transverse arch: preserved Hindfoot breakdown: none Sensation: intact  Assessment and Plan:   Plantar fasciitis, left  >25 minutes spent in face to face time with patient, >50% spent in counselling or coordination of care   Anatomy reviewed. Stretching and rehab are critically important to the treatment of PF. Reviewed footwear. Rigid soles have been shown  to help with PF. Her current shoes have extensive breakdown, recommended getting new ones.   I gave her some arch binders and some sports insoles with a custom made poron heel cushion on the L side.  Reviewed rehab of stretching and calf raises.  Reviewed rehab from [employer]  Patient Instructions  Please read handouts on Plantar Fascitis.    STRETCHING and Strengthening program critically important.    Strengthening on foot and calf muscles as seen in handout.  Calf raises, 2 legged, then 1 legged.  Foot massage with tennis ball.  Ice massage.  Towel Scrunches: get a towel or hand towel, use toes to pick up and scrunch up the towel.  Marble pick-ups, practice picking up marbles with toes and placing into  a cup  NEEDS TO BE DONE EVERY DAY    Recommended over the counter insoles. (Spenco or Hapad)  A rigid shoe with good arch support helps: Dansko (great), Randel Pigg, Merrell No easily bendable shoes.   Tuli's heel cups      Follow-up: if she is still doing poorly in 2 mo or so, then f/u  Signed,  Jaleil Renwick T. Dreya Buhrman, MD   Patient's Medications  New Prescriptions   No medications on file  Previous Medications   FERROUS SULFATE 325 (65 FE) MG TABLET    Take 1 tablet (325 mg total) by mouth every Monday, Wednesday, and Friday.   GLUCOSE BLOOD (ONETOUCH VERIO) TEST STRIP    Check blood sugar once daily and as directed. R73.9   LANCETS (ONETOUCH ULTRASOFT) LANCETS    1 each by Other route daily. Use as instructed   OMEPRAZOLE (PRILOSEC) 20 MG CAPSULE    Take 1-2 capsules (20-40 mg total) by mouth daily.   TRIAMCINOLONE CREAM (KENALOG) 0.1 %    Apply 1 application topically 2 (two) times daily as needed.  Modified Medications   No medications on file  Discontinued Medications   No medications on file

## 2018-04-28 ENCOUNTER — Ambulatory Visit: Payer: BC Managed Care – PPO | Admitting: Family Medicine

## 2018-04-28 ENCOUNTER — Encounter: Payer: Self-pay | Admitting: Family Medicine

## 2018-04-28 VITALS — BP 130/82 | HR 87 | Temp 97.9°F | Ht 59.25 in | Wt 164.5 lb

## 2018-04-28 DIAGNOSIS — M722 Plantar fascial fibromatosis: Secondary | ICD-10-CM

## 2018-04-28 NOTE — Patient Instructions (Signed)

## 2018-04-29 ENCOUNTER — Ambulatory Visit: Payer: BC Managed Care – PPO | Admitting: Family Medicine

## 2018-05-06 DIAGNOSIS — M722 Plantar fascial fibromatosis: Secondary | ICD-10-CM | POA: Insufficient documentation

## 2018-05-09 LAB — HM MAMMOGRAPHY

## 2018-11-07 ENCOUNTER — Other Ambulatory Visit: Payer: Self-pay | Admitting: Family Medicine

## 2018-11-07 DIAGNOSIS — R739 Hyperglycemia, unspecified: Secondary | ICD-10-CM

## 2018-11-07 DIAGNOSIS — E559 Vitamin D deficiency, unspecified: Secondary | ICD-10-CM

## 2018-11-07 DIAGNOSIS — D509 Iron deficiency anemia, unspecified: Secondary | ICD-10-CM

## 2018-11-07 DIAGNOSIS — E7800 Pure hypercholesterolemia, unspecified: Secondary | ICD-10-CM

## 2018-11-08 ENCOUNTER — Other Ambulatory Visit: Payer: Self-pay

## 2018-11-08 ENCOUNTER — Other Ambulatory Visit (INDEPENDENT_AMBULATORY_CARE_PROVIDER_SITE_OTHER): Payer: BC Managed Care – PPO

## 2018-11-08 DIAGNOSIS — E559 Vitamin D deficiency, unspecified: Secondary | ICD-10-CM

## 2018-11-08 DIAGNOSIS — D509 Iron deficiency anemia, unspecified: Secondary | ICD-10-CM | POA: Diagnosis not present

## 2018-11-08 DIAGNOSIS — R739 Hyperglycemia, unspecified: Secondary | ICD-10-CM

## 2018-11-08 DIAGNOSIS — E7800 Pure hypercholesterolemia, unspecified: Secondary | ICD-10-CM | POA: Diagnosis not present

## 2018-11-08 LAB — CBC WITH DIFFERENTIAL/PLATELET
Basophils Absolute: 0 10*3/uL (ref 0.0–0.1)
Basophils Relative: 0.8 % (ref 0.0–3.0)
Eosinophils Absolute: 0.3 10*3/uL (ref 0.0–0.7)
Eosinophils Relative: 6.4 % — ABNORMAL HIGH (ref 0.0–5.0)
HCT: 40.1 % (ref 36.0–46.0)
Hemoglobin: 13.6 g/dL (ref 12.0–15.0)
Lymphocytes Relative: 37.8 % (ref 12.0–46.0)
Lymphs Abs: 1.9 10*3/uL (ref 0.7–4.0)
MCHC: 33.8 g/dL (ref 30.0–36.0)
MCV: 86.5 fl (ref 78.0–100.0)
Monocytes Absolute: 0.4 10*3/uL (ref 0.1–1.0)
Monocytes Relative: 7.7 % (ref 3.0–12.0)
Neutro Abs: 2.4 10*3/uL (ref 1.4–7.7)
Neutrophils Relative %: 47.3 % (ref 43.0–77.0)
Platelets: 231 10*3/uL (ref 150.0–400.0)
RBC: 4.63 Mil/uL (ref 3.87–5.11)
RDW: 12.7 % (ref 11.5–15.5)
WBC: 5 10*3/uL (ref 4.0–10.5)

## 2018-11-08 LAB — COMPREHENSIVE METABOLIC PANEL
ALT: 15 U/L (ref 0–35)
AST: 18 U/L (ref 0–37)
Albumin: 4.1 g/dL (ref 3.5–5.2)
Alkaline Phosphatase: 71 U/L (ref 39–117)
BUN: 18 mg/dL (ref 6–23)
CO2: 28 mEq/L (ref 19–32)
Calcium: 9.5 mg/dL (ref 8.4–10.5)
Chloride: 106 mEq/L (ref 96–112)
Creatinine, Ser: 0.81 mg/dL (ref 0.40–1.20)
GFR: 72.4 mL/min (ref >60.00)
Glucose, Bld: 122 mg/dL — ABNORMAL HIGH (ref 70–99)
Potassium: 4 mEq/L (ref 3.5–5.1)
Sodium: 141 mEq/L (ref 135–145)
Total Bilirubin: 0.4 mg/dL (ref 0.2–1.2)
Total Protein: 6.8 g/dL (ref 6.0–8.3)

## 2018-11-08 LAB — HEMOGLOBIN A1C: Hgb A1c MFr Bld: 6.3 % (ref 4.6–6.5)

## 2018-11-08 LAB — LIPID PANEL
Cholesterol: 176 mg/dL (ref 0–200)
HDL: 46.4 mg/dL (ref >39.00)
LDL Cholesterol: 120 mg/dL — ABNORMAL HIGH (ref 0–99)
NonHDL: 129.77
Total CHOL/HDL Ratio: 4
Triglycerides: 47 mg/dL (ref 0.0–149.0)
VLDL: 9.4 mg/dL (ref 0.0–40.0)

## 2018-11-08 LAB — IRON: Iron: 92 ug/dL (ref 42–145)

## 2018-11-08 LAB — VITAMIN D 25 HYDROXY (VIT D DEFICIENCY, FRACTURES): VITD: 41.64 ng/mL (ref 30.00–100.00)

## 2018-11-14 ENCOUNTER — Encounter: Payer: Self-pay | Admitting: Family Medicine

## 2018-11-14 ENCOUNTER — Ambulatory Visit (INDEPENDENT_AMBULATORY_CARE_PROVIDER_SITE_OTHER): Payer: BC Managed Care – PPO | Admitting: Family Medicine

## 2018-11-14 ENCOUNTER — Other Ambulatory Visit: Payer: Self-pay

## 2018-11-14 VITALS — BP 112/70 | HR 87 | Temp 98.0°F | Ht 59.25 in | Wt 158.1 lb

## 2018-11-14 DIAGNOSIS — Z7189 Other specified counseling: Secondary | ICD-10-CM

## 2018-11-14 DIAGNOSIS — K8020 Calculus of gallbladder without cholecystitis without obstruction: Secondary | ICD-10-CM

## 2018-11-14 DIAGNOSIS — M5430 Sciatica, unspecified side: Secondary | ICD-10-CM

## 2018-11-14 DIAGNOSIS — R739 Hyperglycemia, unspecified: Secondary | ICD-10-CM

## 2018-11-14 DIAGNOSIS — D509 Iron deficiency anemia, unspecified: Secondary | ICD-10-CM

## 2018-11-14 DIAGNOSIS — K219 Gastro-esophageal reflux disease without esophagitis: Secondary | ICD-10-CM

## 2018-11-14 DIAGNOSIS — Z0000 Encounter for general adult medical examination without abnormal findings: Secondary | ICD-10-CM

## 2018-11-14 DIAGNOSIS — Z1211 Encounter for screening for malignant neoplasm of colon: Secondary | ICD-10-CM

## 2018-11-14 MED ORDER — OMEPRAZOLE 20 MG PO CPDR: 20.0000 mg | DELAYED_RELEASE_CAPSULE | Freq: Every day | ORAL | 3 refills | Status: DC

## 2018-11-14 NOTE — Progress Notes (Signed)
CPE- See plan.  Routine anticipatory guidance given to patient.  See health maintenance.  The possibility exists that previously documented standard health maintenance information may have been brought forward from a previous encounter into this note.  If needed, that same information has been updated to reflect the current situation based on today's encounter.    Tetanus 2006- she describes sig local injection site reaction. She doesn't have high risk exposures (ie young kids for tdap).  Defer. PNA and shingles not due.  Flu shot not indicated due to prev reaction. Mammogram and breast exam, per gyn. d/w pt.  Pap per gyn. D/w pt.  I'll defer.   .D/w patient [id number] for colon cancer screening, including IFOB vs. colonoscopy.  Risks and benefits of both were discussed and patient voiced understanding.  Pt elects for: IFOB.   Living will. D/w pt. Would have her husband designated if patient were incapacitated.  Diet and exercise d/w pt. She is still working on weight and diet.  HIV testing prev neg during her pregnancy in 1994.  HCV neg prev.   Pandemic considerations discussed with patient  She wanted to defer cholecystectomy for now.  She has no symptoms with a low-fat diet.  Routine cautions given.  If she has any right upper quadrant pain that would prompt evaluation, discussed with patient.   Sugar has been 110-117 on home checks.  Labs discussed with patient.  Not diabetic based on labs.  Hx anemia.  She had nausea taking iron and that improved off med.    GERD, controlled on prilosec.  Needed refill.  Used prn, not everyday.    She has had some episodic left-sided sciatica symptoms.  Exacerbated by prolonged standing at work.  Discussed options and home exercise program, handout given to patient and discussed.  No symptoms currently.  She will update me as needed.  PMH and SH reviewed  Meds, vitals, and allergies reviewed.   ROS: Per HPI.  Unless specifically indicated  otherwise in HPI, the patient denies:  General: fever. Eyes: acute vision changes ENT: sore throat Cardiovascular: chest pain Respiratory: SOB GI: vomiting GU: dysuria Musculoskeletal: acute back pain Derm: acute rash Neuro: acute motor dysfunction Psych: worsening mood Endocrine: polydipsia Heme: bleeding Allergy: hayfever  GEN: nad, alert and oriented HEENT: ncat NECK: supple w/o LA CV: rrr. PULM: ctab, no inc wob ABD: soft, +bs EXT: no edema SKIN: no acute rash

## 2018-11-14 NOTE — Patient Instructions (Addendum)
Go to the lab on the way out.  We'll contact you with your lab report. Don't change your meds for now.  Update me as needed.  Thanks for your effort.  Take care.  Glad to see you.

## 2018-11-16 DIAGNOSIS — M5430 Sciatica, unspecified side: Secondary | ICD-10-CM | POA: Insufficient documentation

## 2018-11-16 NOTE — Assessment & Plan Note (Addendum)
Tetanus 2006- she describes sig local injection site reaction. She doesn't have high risk exposures (ie young kids for tdap).  Defer. PNA and shingles not due.  Flu shot not indicated due to prev reaction. Mammogram and breast exam, per gyn. d/w pt.  Pap per gyn. D/w pt.  I'll defer.   .D/w patient [id number] for colon cancer screening, including IFOB vs. colonoscopy.  Risks and benefits of both were discussed and patient voiced understanding.  Pt elects for: IFOB.   Living will. D/w pt. Would have her husband designated if patient were incapacitated.  Diet and exercise d/w pt. She is still working on weight and diet.  HIV testing prev neg during her pregnancy in 1994.  HCV neg prev.

## 2018-11-16 NOTE — Assessment & Plan Note (Signed)
GERD, controlled on prilosec.  Needed refill.  Used prn, not everyday.

## 2018-11-16 NOTE — Assessment & Plan Note (Signed)
Living will. D/w pt. Would have her husband designated if patient were incapacitated.   

## 2018-11-16 NOTE — Assessment & Plan Note (Signed)
Hx anemia.  She had nausea taking iron and that improved off med.   Labs are reasonable in the meantime.  Continue as is.  She agrees.

## 2018-11-16 NOTE — Assessment & Plan Note (Signed)
She wanted to defer cholecystectomy for now.  She has no symptoms with a low-fat diet.  Routine cautions given.  If she has any right upper quadrant pain that would prompt evaluation, discussed with patient.  

## 2018-11-16 NOTE — Assessment & Plan Note (Signed)
  Sugar has been 110-117 on home checks.  Labs discussed with patient.  Not diabetic based on labs.  Continue work on diet and exercise.

## 2018-11-16 NOTE — Assessment & Plan Note (Signed)
She has had some episodic left-sided sciatica symptoms.  Exacerbated by prolonged standing at work.  Discussed options and home exercise program, handout given to patient and discussed.  No symptoms currently.  She will update me as needed.  No weakness or alarming red flag symptoms.

## 2018-11-29 ENCOUNTER — Telehealth: Payer: Self-pay | Admitting: Family Medicine

## 2018-11-29 NOTE — Telephone Encounter (Signed)
Patient is calling in regards to a Fecal test she done She stated she has not heard anything back from it and would like to know if our office has heard

## 2018-11-29 NOTE — Telephone Encounter (Signed)
Sent a note to Terri to ask about results.

## 2018-12-02 ENCOUNTER — Encounter: Payer: Self-pay | Admitting: Diagnostic Radiology

## 2018-12-02 ENCOUNTER — Encounter: Payer: Self-pay | Admitting: Obstetrics and Gynecology

## 2018-12-08 NOTE — Telephone Encounter (Signed)
I spoke with pt and informed her this message.  Pt verbalized understanding.

## 2018-12-08 NOTE — Telephone Encounter (Signed)
I have emailed the lab manager, Senaida Ores, at [facility] to discuss this and to have her investigate this further.  Also, Terr in our lab is speaking directly with Santiago Glad from [facility] to investigate further. Apparently, we have been informed that the mail service sometimes holds the cards for up to 30 days, which is concerning and we are looking in to this.   FYI to Dr. Damita Dunnings.

## 2018-12-09 NOTE — Telephone Encounter (Signed)
Santiago Glad from the [facility] lab is calling the post office today to see if there is a problem with delivery

## 2018-12-09 NOTE — Telephone Encounter (Addendum)
Thank you for checking on this.  Please let me know what you hear.  The other option is to have her complete another sample and then drop it off at the clinic directly, if needed.

## 2018-12-10 NOTE — Telephone Encounter (Signed)
Please let me know what you hear.   The other option is to have her complete another sample and then drop it off at the clinic directly, if we don't get a result in the near future.   Thanks.

## 2018-12-12 NOTE — Telephone Encounter (Signed)
Per patients request, ifob kit mailed to her today. She will drop this off in the back specimen container

## 2018-12-16 ENCOUNTER — Other Ambulatory Visit (INDEPENDENT_AMBULATORY_CARE_PROVIDER_SITE_OTHER): Payer: BC Managed Care – PPO

## 2018-12-16 DIAGNOSIS — Z1211 Encounter for screening for malignant neoplasm of colon: Secondary | ICD-10-CM

## 2018-12-16 LAB — FECAL OCCULT BLOOD, IMMUNOCHEMICAL: Fecal Occult Bld: NEGATIVE

## 2019-02-27 ENCOUNTER — Telehealth: Payer: Self-pay | Admitting: *Deleted

## 2019-02-27 NOTE — Telephone Encounter (Addendum)
Patient left a voicemail stating that she has questions about covid and requested a call back.  Called patient back and had to leave a voicemail for patient to return our call.

## 2019-02-28 NOTE — Telephone Encounter (Signed)
Called patient again and was able to talk with her. Patient stated that she has questions about covid. Patient stated that she went to her good friends house Saturday to drop some items off and then their friend got sick Monday and went for covid testing. Mrs. Parlier started that they were outside and 6 feet apart, but no one was wearing a mask and they were there for about 15 minutes. . Mrs. Carnegie wanted to know if she needs to quarantine until their friend gets his results back? Advised patient that it would be a good idea if she and her family quarantined until her friend does get his results back. Patient stated that she does plan to quarantine until she gets his results.  Mrs. Mcquitty had questions about what she should do if she or her family develop symptoms or if her friend is positive? Information was given to her regarding sitting up an appointment at one of the [facility] testing sites. ER precautions were given to patient.

## 2019-02-28 NOTE — Telephone Encounter (Signed)
Agreed. Thanks.  I didn't see anything else for [place] to do at this point.  If there is, then please let me know.

## 2019-05-11 ENCOUNTER — Encounter: Payer: Self-pay | Admitting: Internal Medicine

## 2019-05-11 ENCOUNTER — Other Ambulatory Visit: Payer: Self-pay

## 2019-05-11 ENCOUNTER — Ambulatory Visit: Payer: BC Managed Care – PPO | Admitting: Internal Medicine

## 2019-05-11 VITALS — BP 142/94 | HR 103 | Temp 98.1°F | Wt 159.0 lb

## 2019-05-11 DIAGNOSIS — F419 Anxiety disorder, unspecified: Secondary | ICD-10-CM

## 2019-05-11 DIAGNOSIS — F410 Panic disorder [episodic paroxysmal anxiety] without agoraphobia: Secondary | ICD-10-CM | POA: Diagnosis not present

## 2019-05-11 MED ORDER — HYDROXYZINE HCL 10 MG PO TABS: 10.0000 mg | ORAL_TABLET | Freq: Every day | ORAL | 0 refills | Status: DC | PRN

## 2019-05-11 NOTE — Patient Instructions (Signed)

## 2019-05-11 NOTE — Progress Notes (Signed)
Subjective:    Patient ID: Sara Leonard, female    DOB: 1960-10-11, 59 y.o.   MRN: [id number]  HPI  Pt presents to the clinic today with c/o anxiety and panic attacks. She reports she is under a lot of stress lately, caring for her mom, recently put her dog down and her son has OCD. With increased stress and work and living during a pandemic, she thinks this has made her anxiety worse to the point where she gets increased heart rate, elevated blood pressure, SOB and some lightheadedness. She remembers feeling this way after her dad died. She reports Dr. Damita Dunnings tried her on Paroxetine in the past, but she did not like the way it made her feel. She denies depression, SI/HI.  Review of Systems      Past Medical History:  Diagnosis Date  . Anxiety   . GERD (gastroesophageal reflux disease)   . Iron deficiency    long standing, + FH and predates onset of menses  . White coat hypertension     Current Outpatient Medications  Medication Sig Dispense Refill  . glucose blood (ONETOUCH VERIO) test strip Check blood sugar once daily and as directed. R73.9 100 each 3  . Lancets (ONETOUCH ULTRASOFT) lancets 1 each by Other route daily. Use as instructed 100 each 3  . omeprazole (PRILOSEC) 20 MG capsule Take 1-2 capsules (20-40 mg total) by mouth daily. 180 capsule 3  . triamcinolone cream (KENALOG) 0.1 % Apply 1 application topically 2 (two) times daily as needed. 453.6 g 0   No current facility-administered medications for this visit.    Allergies  Allergen Reactions  . Penicillins     REACTION: rash  . Azithromycin   . Erythromycin Other (See Comments)    GI upset  . Influenza Vaccines     Local reaction, sig local erythema.    . Tdap [Tetanus-Diphth-Acell Pertussis] Other (See Comments)    Injection site reaction    Family History  Problem Relation Age of Onset  . Diabetes Mother   . Hypertension Mother   . Coronary artery disease Father   . Heart attack Father   .  Hypertension Father   . Hyperlipidemia Father   . Kidney disease Father   . Diabetes Father   . Colon cancer Neg Hx   . Breast cancer Neg Hx     Social History   Socioeconomic History  . Marital status: Married    Spouse name: Not on file  . Number of children: 2  . Years of education: Not on file  . Highest education level: Not on file  Occupational History  . Occupation: Oceanographer: SELF-EMPLOYED  Tobacco Use  . Smoking status: Never Smoker  . Smokeless tobacco: Never Used  Substance and Sexual Activity  . Alcohol use: No    Alcohol/week: 0.0 standard drinks  . Drug use: No  . Sexual activity: Not on file  Other Topics Concern  . Not on file  Social History Narrative   Married 1988   2 sons   hairdresser   Social Determinants of Radio broadcast assistant Strain:   . Difficulty of Paying Living Expenses:   Food Insecurity:   . Worried About Charity fundraiser in the Last Year:   . Arboriculturist in the Last Year:   Transportation Needs:   . Film/video editor (Medical):   Marland Kitchen Lack of Transportation (Non-Medical):   Physical Activity:   .  Days of Exercise per Week:   . Minutes of Exercise per Session:   Stress:   . Feeling of Stress :   Social Connections:   . Frequency of Communication with Friends and Family:   . Frequency of Social Gatherings with Friends and Family:   . Attends Religious Services:   . Active Member of Clubs or Organizations:   . Attends Banker Meetings:   Marland Kitchen Marital Status:   Intimate Partner Violence:   . Fear of Current or Ex-Partner:   . Emotionally Abused:   Marland Kitchen Physically Abused:   . Sexually Abused:      Constitutional: Denies fever, malaise, fatigue, headache or abrupt weight changes.  Respiratory: Denies difficulty breathing, shortness of breath, cough or sputum production.   Cardiovascular: Denies chest pain, chest tightness, palpitations or swelling in the hands or feet.  Gastrointestinal:  Denies abdominal pain, bloating, constipation, diarrhea or blood in the stool.  Neurological: Denies dizziness, difficulty with memory, difficulty with speech or problems with balance and coordination.  Psych: Pt reports anxiety and panic attacks. Denies depression, SI/HI.  No other specific complaints in a complete review of systems (except as listed in HPI above).  Objective:   Physical Exam   BP (!) 142/94   Pulse (!) 103   Temp 98.1 F (36.7 C) (Temporal)   Wt 159 lb (72.1 kg)   LMP 05/10/2011   SpO2 98%   BMI 31.84 kg/m   Wt Readings from Last 3 Encounters:  11/14/18 158 lb 1.6 oz (71.7 kg)  04/28/18 164 lb 8 oz (74.6 kg)  10/29/17 159 lb 12 oz (72.5 kg)    General: Appears her stated age, well developed, well nourished in NAD. Cardiovascular: Tachycardic with normal rhythm. S1,S2 noted.  No murmur, rubs or gallops noted.  Pulmonary/Chest: Normal effort and positive vesicular breath sounds. No respiratory distress. No wheezes, rales or ronchi noted.  Neurological: Alert and oriented.  Psychiatric: Anxious appearing. Judgment and thought content normal.     BMET    Component Value Date/Time   NA 141 11/08/2018 0736   K 4.0 11/08/2018 0736   CL 106 11/08/2018 0736   CO2 28 11/08/2018 0736   GLUCOSE 122 (H) 11/08/2018 0736   BUN 18 11/08/2018 0736   CREATININE 0.81 11/08/2018 0736   CALCIUM 9.5 11/08/2018 0736   GFRNONAA 81.11 09/25/2008 1120   GFRAA 99 07/12/2007 0857    Lipid Panel     Component Value Date/Time   CHOL 176 11/08/2018 0736   TRIG 47.0 11/08/2018 0736   HDL 46.40 11/08/2018 0736   CHOLHDL 4 11/08/2018 0736   VLDL 9.4 11/08/2018 0736   LDLCALC 120 (H) 11/08/2018 0736    CBC    Component Value Date/Time   WBC 5.0 11/08/2018 0736   RBC 4.63 11/08/2018 0736   HGB 13.6 11/08/2018 0736   HCT 40.1 11/08/2018 0736   PLT 231.0 11/08/2018 0736   MCV 86.5 11/08/2018 0736   MCHC 33.8 11/08/2018 0736   RDW 12.7 11/08/2018 0736   LYMPHSABS  1.9 11/08/2018 0736   MONOABS 0.4 11/08/2018 0736   EOSABS 0.3 11/08/2018 0736   BASOSABS 0.0 11/08/2018 0736    Hgb A1C Lab Results  Component Value Date   HGBA1C 6.3 11/08/2018        Assessment & Plan:   Anxiety and Panic Attacks:  Support offered today She is not interested in SSRI therapy RX for Hydroxyzine 10 mg daily prn- sedation caution given  RTC  as needed or if symptoms persist or worsen Nicki Reaper, NP This visit occurred during the SARS-CoV-2 public health emergency.  Safety protocols were in place, including screening questions prior to the visit, additional usage of staff PPE, and extensive cleaning of exam room while observing appropriate contact time as indicated for disinfecting solutions.

## 2019-05-18 ENCOUNTER — Telehealth: Payer: Self-pay | Admitting: Family Medicine

## 2019-05-18 NOTE — Telephone Encounter (Signed)
Stress can affect BS but I think she should avoid checking for now because it seems to be making her more anxious. If she is concerned with blood sugars, would recommend she discuss with PCP when he comes back to see if he wants to see her in office vs lab only appt for A1C.

## 2019-05-18 NOTE — Telephone Encounter (Signed)
Pt calling and is concerned with her BS readings. Pt states that since she has her panic attack last week she has been having abnormal BS readings. Pt states that she is not eating any differently than her normal.  States that this morning she woke up and checked it and it was in the 140s (fasting). She states that sometimes it will be 130s-140s first thing and then after she eats and throughout the day will come down to 90s-120s at the highest.  She states that last night she did eat some wheaties with a little peanut butter before bed because she was hungry.  Wondering if her panic attacks could be causing her BS levels to be abnormal - pt states that she read online that it can affect it.   Pt aware that Dr Para March is not available and this message would be sent to Dr Reece Agar.  Pt requested that this be sent to [facility] as she saw her 05/11/19 for her Panic Attack.   Please advise, thanks.

## 2019-05-18 NOTE — Telephone Encounter (Signed)
I spoke to pt and she is aware as instructed... she would like to know if you could give her range of BG numbers that she should be in and not worry...Marland Kitchen please advise... pt understands anything further she will need to f/u with Dr Para March

## 2019-05-19 ENCOUNTER — Other Ambulatory Visit: Payer: Self-pay | Admitting: Family Medicine

## 2019-05-19 NOTE — Telephone Encounter (Signed)
For a non diabetic, fasting sugars should be < 100 and less than 140 2 hours after meals.

## 2019-05-22 ENCOUNTER — Encounter: Payer: Self-pay | Admitting: Family Medicine

## 2019-05-22 ENCOUNTER — Ambulatory Visit: Payer: BC Managed Care – PPO | Admitting: Family Medicine

## 2019-05-22 ENCOUNTER — Other Ambulatory Visit: Payer: Self-pay

## 2019-05-22 VITALS — BP 144/80 | HR 89 | Temp 97.1°F | Ht 59.25 in | Wt 158.4 lb

## 2019-05-22 DIAGNOSIS — R739 Hyperglycemia, unspecified: Secondary | ICD-10-CM | POA: Diagnosis not present

## 2019-05-22 DIAGNOSIS — F411 Generalized anxiety disorder: Secondary | ICD-10-CM | POA: Diagnosis not present

## 2019-05-22 DIAGNOSIS — R7303 Prediabetes: Secondary | ICD-10-CM | POA: Insufficient documentation

## 2019-05-22 LAB — POCT GLYCOSYLATED HEMOGLOBIN (HGB A1C): Hemoglobin A1C: 6 % — AB (ref 4.0–5.6)

## 2019-05-22 NOTE — Progress Notes (Signed)
This visit occurred during the SARS-CoV-2 public health emergency.  Safety protocols were in place, including screening questions prior to the visit, additional usage of staff PPE, and extensive cleaning of exam room while observing appropriate contact time as indicated for disinfecting solutions.  Anxiety.  BP is lower at home, 125/74 at home.  She has white coat BP elevation.  She hasn't had to use hydroxyzine in the meantime.  "I've been try to breath through it" and " I haven't had bad episodes like I did before."  She is caring for her mother, her sons have health concerns, her dog died, and the pandemic is ongoing.  No SI/HI.  D/w pt about options.  She felt better after she was able to get some rest.    Hyperglycemia.  Sugar 120-140s in the AMs.  Lower (<100) later in the days.  Mildly lower sugars improved with diet normalization.  A1c done at OV.  Hypoglycemia cautions d/w pt.  A1c d/w pt.    She is not having sx attributed to her gall bladder with diet control.  She avoids fatty foods.   We talked about prev injection site reactions with other vaccines and she was worried about covid vaccine, d/w pt.  She wanted to get more information in the meantime as data become available, esp given her hx of anxiety.    Meds, vitals, and allergies reviewed.   ROS: Per HPI unless specifically indicated in ROS section   GEN: nad, alert and oriented HEENT: ncat NECK: supple w/o LA CV: rrr PULM: ctab, no inc wob ABD: soft, +bs EXT: no edema SKIN: no acute rash

## 2019-05-22 NOTE — Telephone Encounter (Signed)
Pt had f/u with Dr Para March

## 2019-05-22 NOTE — Patient Instructions (Signed)
I would avoid prolonged fasting and update me as needed.   Take care.  Glad to see you.

## 2019-05-24 NOTE — Assessment & Plan Note (Signed)
Improved in the meantime.  She can use hydroxyzine as needed.  Okay for outpatient follow-up.

## 2019-05-24 NOTE — Assessment & Plan Note (Signed)
Sugar 120-140s in the AMs.  Lower (<100) later in the days.  Mildly lower sugars improved with diet normalization.  A1c done at OV.  Hypoglycemia cautions d/w pt.  A1c d/w pt.   no change in meds at this point.  Continue as is.  She agrees.

## 2019-11-10 ENCOUNTER — Telehealth: Payer: Self-pay | Admitting: *Deleted

## 2019-11-10 NOTE — Telephone Encounter (Signed)
We can't guarantee her safety but she should be able to follow social distancing guidelines and go to the restaurant.  I would mask on the way in and out and limit exposures to others when inside.  Thanks.

## 2019-11-10 NOTE — Telephone Encounter (Signed)
Pt called Triage and wanted Dr. Lianne Bushy opinion on a situation. Pt is allergic to 2 ingredients in the Covid vaccine and is also allergic to Td and flu vaccine. She has spoke with a pharmacist and also called the CDC and they both advised that she shouldn't get the vaccine due to her allergies.   Pt's questions is she was invited to [employer] for a birthday dinner. It's only going to be 3 people in her group but pt hasn't been to a restaurant since Covid started. Pt wanted to know if PCP thinks it's safe for her to go to the dinner if she stays in the car until their table is ready, and then only take her mask off to eat then put it back on, and also use hand sanitizer frequently. Pt wants Dr. Lianne Bushy professional advise before she decides if she will go.

## 2019-11-10 NOTE — Telephone Encounter (Signed)
Patient advised.

## 2019-11-15 ENCOUNTER — Other Ambulatory Visit: Payer: Self-pay | Admitting: Family Medicine

## 2019-11-15 DIAGNOSIS — E559 Vitamin D deficiency, unspecified: Secondary | ICD-10-CM

## 2019-11-15 DIAGNOSIS — D509 Iron deficiency anemia, unspecified: Secondary | ICD-10-CM

## 2019-11-15 DIAGNOSIS — R739 Hyperglycemia, unspecified: Secondary | ICD-10-CM

## 2019-11-15 DIAGNOSIS — E7800 Pure hypercholesterolemia, unspecified: Secondary | ICD-10-CM

## 2019-11-16 ENCOUNTER — Other Ambulatory Visit: Payer: Self-pay | Admitting: Family Medicine

## 2019-11-17 ENCOUNTER — Other Ambulatory Visit (INDEPENDENT_AMBULATORY_CARE_PROVIDER_SITE_OTHER): Payer: BC Managed Care – PPO

## 2019-11-17 ENCOUNTER — Other Ambulatory Visit: Payer: Self-pay

## 2019-11-17 DIAGNOSIS — D509 Iron deficiency anemia, unspecified: Secondary | ICD-10-CM | POA: Diagnosis not present

## 2019-11-17 DIAGNOSIS — E7800 Pure hypercholesterolemia, unspecified: Secondary | ICD-10-CM | POA: Diagnosis not present

## 2019-11-17 DIAGNOSIS — R739 Hyperglycemia, unspecified: Secondary | ICD-10-CM

## 2019-11-17 DIAGNOSIS — E559 Vitamin D deficiency, unspecified: Secondary | ICD-10-CM | POA: Diagnosis not present

## 2019-11-17 LAB — CBC WITH DIFFERENTIAL/PLATELET
Basophils Absolute: 0 10*3/uL (ref 0.0–0.1)
Basophils Relative: 0.8 % (ref 0.0–3.0)
Eosinophils Absolute: 0.4 10*3/uL (ref 0.0–0.7)
Eosinophils Relative: 6.7 % — ABNORMAL HIGH (ref 0.0–5.0)
HCT: 40.2 % (ref 36.0–46.0)
Hemoglobin: 13.6 g/dL (ref 12.0–15.0)
Lymphocytes Relative: 34.9 % (ref 12.0–46.0)
Lymphs Abs: 2 10*3/uL (ref 0.7–4.0)
MCHC: 34 g/dL (ref 30.0–36.0)
MCV: 86.1 fl (ref 78.0–100.0)
Monocytes Absolute: 0.4 10*3/uL (ref 0.1–1.0)
Monocytes Relative: 7.3 % (ref 3.0–12.0)
Neutro Abs: 2.9 10*3/uL (ref 1.4–7.7)
Neutrophils Relative %: 50.3 % (ref 43.0–77.0)
Platelets: 233 10*3/uL (ref 150.0–400.0)
RBC: 4.66 Mil/uL (ref 3.87–5.11)
RDW: 12.8 % (ref 11.5–15.5)
WBC: 5.7 10*3/uL (ref 4.0–10.5)

## 2019-11-17 LAB — COMPREHENSIVE METABOLIC PANEL
ALT: 16 U/L (ref 0–35)
AST: 17 U/L (ref 0–37)
Albumin: 4.4 g/dL (ref 3.5–5.2)
Alkaline Phosphatase: 76 U/L (ref 39–117)
BUN: 15 mg/dL (ref 6–23)
CO2: 30 mEq/L (ref 19–32)
Calcium: 10 mg/dL (ref 8.4–10.5)
Chloride: 104 mEq/L (ref 96–112)
Creatinine, Ser: 0.77 mg/dL (ref 0.40–1.20)
GFR: 76.49 mL/min (ref >60.00)
Glucose, Bld: 119 mg/dL — ABNORMAL HIGH (ref 70–99)
Potassium: 4.1 mEq/L (ref 3.5–5.1)
Sodium: 141 mEq/L (ref 135–145)
Total Bilirubin: 0.6 mg/dL (ref 0.2–1.2)
Total Protein: 7.2 g/dL (ref 6.0–8.3)

## 2019-11-17 LAB — HEMOGLOBIN A1C: Hgb A1c MFr Bld: 6.5 % (ref 4.6–6.5)

## 2019-11-17 LAB — VITAMIN D 25 HYDROXY (VIT D DEFICIENCY, FRACTURES): VITD: 38.9 ng/mL (ref 30.00–100.00)

## 2019-11-17 LAB — IRON: Iron: 103 ug/dL (ref 42–145)

## 2019-11-17 LAB — LDL CHOLESTEROL, DIRECT: Direct LDL: 135 mg/dL

## 2019-11-20 ENCOUNTER — Encounter: Payer: BC Managed Care – PPO | Admitting: Family Medicine

## 2019-11-23 ENCOUNTER — Ambulatory Visit (INDEPENDENT_AMBULATORY_CARE_PROVIDER_SITE_OTHER): Payer: BC Managed Care – PPO | Admitting: Family Medicine

## 2019-11-23 ENCOUNTER — Other Ambulatory Visit: Payer: Self-pay

## 2019-11-23 ENCOUNTER — Encounter: Payer: Self-pay | Admitting: Family Medicine

## 2019-11-23 VITALS — BP 142/82 | HR 88 | Temp 96.9°F | Ht 59.25 in | Wt 156.2 lb

## 2019-11-23 DIAGNOSIS — K219 Gastro-esophageal reflux disease without esophagitis: Secondary | ICD-10-CM

## 2019-11-23 DIAGNOSIS — Z7189 Other specified counseling: Secondary | ICD-10-CM

## 2019-11-23 DIAGNOSIS — M5430 Sciatica, unspecified side: Secondary | ICD-10-CM

## 2019-11-23 DIAGNOSIS — D509 Iron deficiency anemia, unspecified: Secondary | ICD-10-CM

## 2019-11-23 DIAGNOSIS — K8020 Calculus of gallbladder without cholecystitis without obstruction: Secondary | ICD-10-CM

## 2019-11-23 DIAGNOSIS — Z1211 Encounter for screening for malignant neoplasm of colon: Secondary | ICD-10-CM

## 2019-11-23 DIAGNOSIS — E119 Type 2 diabetes mellitus without complications: Secondary | ICD-10-CM

## 2019-11-23 DIAGNOSIS — Z0000 Encounter for general adult medical examination without abnormal findings: Secondary | ICD-10-CM | POA: Diagnosis not present

## 2019-11-23 NOTE — Progress Notes (Signed)
This visit occurred during the SARS-CoV-2 public health emergency.  Safety protocols were in place, including screening questions prior to the visit, additional usage of staff PPE, and extensive cleaning of exam room while observing appropriate contact time as indicated for disinfecting solutions.  CPE- See plan.  Routine anticipatory guidance given to patient.  See health maintenance.  The possibility exists that previously documented standard health maintenance information may have been brought forward from a previous encounter into this note.  If needed, that same information has been updated to reflect the current situation based on today's encounter.    Tetanus 2006- she describes sig local injection site reaction. She doesn't have high risk exposures (ie young kids for tdap).  Defer.  Discussed. PNA and shingles not due.  Flu shot not indicated due to prev reaction. covid vaccine d/w pt.  See below.   Mammogram and breast exam, per gyn. d/w pt.  Papper gyn.D/w pt. I'll defer.  D/w patient [id number] for colon cancer screening, including IFOB vs. colonoscopy. Risks and benefits of both were discussed and patient voiced understanding. Pt elects for: IFOB. Living will. D/w pt. Would have her husband designated if patient were incapacitated.  Diet and exercise d/w pt. HIV testing prev neg during her pregnancy in 1994.  HCV negprev.   BP was 124/77 at home.  She has white coat HTN.    Pandemic considerations discussed with patient.  She has h/o allergy to propylene glycol and that is in the covid vaccine.  She called the CDC and was advised not to get the vaccine.    She wanted to defer cholecystectomy for now. She has no symptoms with a low-fat diet. Routine cautions given. If she has any right upper quadrant pain that would prompt evaluation, discussed with patient.   She had been off diet some this summer.  A1c up to 6.5.  Sugar 119 on labs here.  She is still working  on exercise at baseline.  She has put in a lot of effort.  No foot sx.    Hx anemia.  off iron.  Cbc wnl, d/w pt.    GERD, controlled on prilosec.  Used most days recently.    Lower back pain.  Better than prev.  Exacerbated by prolonged standing at work.  prev numbness is better.  She will update me as needed.  PMH and SH reviewed  Meds, vitals, and allergies reviewed.   ROS: Per HPI.  Unless specifically indicated otherwise in HPI, the patient denies:  General: fever. Eyes: acute vision changes ENT: sore throat Cardiovascular: chest pain Respiratory: SOB GI: vomiting GU: dysuria Musculoskeletal: acute back pain Derm: acute rash Neuro: acute motor dysfunction Psych: worsening mood Endocrine: polydipsia Heme: bleeding Allergy: hayfever  GEN: nad, alert and oriented HEENT: ncat NECK: supple w/o LA CV: rrr. PULM: ctab, no inc wob ABD: soft, +bs EXT: no edema SKIN: no acute rash  Diabetic foot exam: Normal inspection No skin breakdown No calluses  Normal DP pulses Normal sensation to light touch and monofilament Nails normal

## 2019-11-23 NOTE — Patient Instructions (Addendum)
Talk to your pharmacy about the shingles/pneumonia shots.  Take care.  Glad to see you. Recheck A1c in about 6 months at a visit.   Thanks for your effort.    Go to the lab on the way out.   If you have mychart we'll likely use that to update you.

## 2019-11-26 NOTE — Assessment & Plan Note (Signed)
GERD, controlled on prilosec.  Used most days recently.

## 2019-11-26 NOTE — Assessment & Plan Note (Signed)
She had been off diet some this summer.  A1c up to 6.5.  Sugar 119 on labs here.  She is still working on exercise at baseline.  She has put in a lot of effort.  No foot sx.  Recheck A1c in about 6 months at a visit.   No change in meds at this point.  She can likely improve A1c with diet and exercise.

## 2019-11-26 NOTE — Assessment & Plan Note (Signed)
Hx anemia.  off iron.  Cbc wnl, d/w pt. we can recheck periodically.  Would continue as is for now.  She agrees.

## 2019-11-26 NOTE — Assessment & Plan Note (Signed)
Lower back pain.  Better than prev.  Exacerbated by prolonged standing at work.  prev numbness is better.  She will update me as needed.

## 2019-11-26 NOTE — Assessment & Plan Note (Signed)
  She wanted to defer cholecystectomy for now. She has no symptoms with a low-fat diet. Routine cautions given. If she has any right upper quadrant pain that would prompt evaluation, discussed with patient.

## 2019-11-26 NOTE — Assessment & Plan Note (Signed)
Tetanus 2006- she describes sig local injection site reaction. She doesn't have high risk exposures (ie young kids for tdap).  Defer.  Discussed. PNA and shingles not due.  Flu shot not indicated due to prev reaction. covid vaccine d/w pt.  See below.   Mammogram and breast exam, per gyn. d/w pt.  Papper gyn.D/w pt. I'll defer.  D/w patient TM:[id number] for colon cancer screening, including IFOB vs. colonoscopy. Risks and benefits of both were discussed and patient voiced understanding. Pt elects for: IFOB. Living will. D/w pt. Would have her husband designated if patient were incapacitated.  Diet and exercise d/w pt. HIV testing prev neg during her pregnancy in 1994.  HCV negprev.   BP was 124/77 at home.  She has white coat HTN.    Pandemic considerations discussed with patient.  She has h/o allergy to propylene glycol and that is in the covid vaccine.  She called the CDC and was advised not to get the vaccine.  She is masking and trying to be as careful as she can.  Discussed.

## 2019-11-26 NOTE — Assessment & Plan Note (Signed)
Living will. D/w pt. Would have her husband designated if patient were incapacitated.   

## 2019-11-28 ENCOUNTER — Other Ambulatory Visit (INDEPENDENT_AMBULATORY_CARE_PROVIDER_SITE_OTHER): Payer: BC Managed Care – PPO

## 2019-11-28 DIAGNOSIS — Z1211 Encounter for screening for malignant neoplasm of colon: Secondary | ICD-10-CM

## 2019-11-28 LAB — FECAL OCCULT BLOOD, IMMUNOCHEMICAL: Fecal Occult Bld: NEGATIVE

## 2020-01-20 ENCOUNTER — Encounter: Payer: Self-pay | Admitting: Family Medicine

## 2020-01-20 ENCOUNTER — Ambulatory Visit
Admission: EM | Admit: 2020-01-20 | Discharge: 2020-01-20 | Disposition: A | Discharge status: Home / Self Care | Payer: BC Managed Care – PPO | Attending: Family Medicine | Admitting: Family Medicine

## 2020-01-20 ENCOUNTER — Other Ambulatory Visit: Payer: Self-pay

## 2020-01-20 DIAGNOSIS — R0981 Nasal congestion: Secondary | ICD-10-CM

## 2020-01-20 DIAGNOSIS — H9201 Otalgia, right ear: Secondary | ICD-10-CM | POA: Diagnosis not present

## 2020-01-20 NOTE — Discharge Instructions (Addendum)
Eardrum shows slight change consistent with pressure from the eustachian tubes and sinuses.  There is no redness or significant fluid behind the eardrum.  At this point I think the discomfort is coming from pressure and with your recent sinus congestion, this may very well be from seasonal change and/or allergies.  Usually we use an antihistamine in a situation like this and expect the symptoms to clear over the next couple days.

## 2020-01-20 NOTE — ED Triage Notes (Signed)
Pt is here with right ear pain that started last night, pt has not taken any meds to relieve discomfort. Pt states she has a hx of sinuses.

## 2020-01-20 NOTE — ED Provider Notes (Signed)
[facility]    CSN: [id number] Arrival date & time: 01/20/20  0858      History   Chief Complaint Chief Complaint  Patient presents with  . Otalgia    HPI Sara Leonard is a 59 y.o. female.   Initial [facility] patient visit.  59 yo woman presents with right ear pain which began last night.  Patient has had sinus problems in the past.  No meds taken so far.     Past Medical History:  Diagnosis Date  . Anxiety   . GERD (gastroesophageal reflux disease)   . Iron deficiency    long standing, + FH and predates onset of menses  . White coat hypertension     Patient Active Problem List   Diagnosis Date Noted  . Sciatica 11/16/2018  . White coat syndrome without diagnosis of hypertension 10/26/2016  . Hives 08/05/2015  . Calculus of gallbladder without cholecystitis without obstruction 02/16/2015  . Hepatomegaly 02/12/2015  . Advance care planning 05/22/2014  . Diabetes mellitus without complication (HCC) 05/22/2014  . Vitamin D deficiency 05/22/2014  . Routine general medical examination at a health care facility 08/01/2012  . Knee internal derangement 10/13/2010  . HYPERCHOLESTEROLEMIA 09/26/2008  . ANEMIA, IRON DEFICIENCY 09/26/2008  . Anxiety state 09/26/2008  . GERD 09/26/2008    Past Surgical History:  Procedure Laterality Date  . CESAREAN SECTION     x2  . TONSILLECTOMY      OB History   No obstetric history on file.      Home Medications    Prior to Admission medications   Medication Sig Start Date End Date Taking? Authorizing Provider  hydrOXYzine (ATARAX/VISTARIL) 10 MG tablet Take 1 tablet (10 mg total) by mouth daily as needed. 05/11/19   Lorre Munroe, NP  Lancets [facility] ULTRASOFT) lancets 1 each by Other route daily. Use as instructed 09/17/16   Joaquim Nam, MD  omeprazole (PRILOSEC) 20 MG capsule TAKE 1-2 CAPSULES BY MOUTH DAILY 11/16/19   Joaquim Nam, MD  [facility] VERIO test strip CHECK BLOOD SUGAR ONCE DAILY AS  DIRECTED 05/19/19   Joaquim Nam, MD  triamcinolone cream (KENALOG) 0.1 % Apply 1 application topically 2 (two) times daily as needed. 10/26/16   Joaquim Nam, MD  valACYclovir Ralph Dowdy) 1000 MG tablet Take by mouth. 08/24/19   [provider]    Family History Family History  Problem Relation Age of Onset  . Diabetes Mother   . Hypertension Mother   . Coronary artery disease Father   . Heart attack Father   . Hypertension Father   . Hyperlipidemia Father   . Kidney disease Father   . Diabetes Father   . Colon cancer Neg Hx   . Breast cancer Neg Hx     Social History Social History   Tobacco Use  . Smoking status: Never Smoker  . Smokeless tobacco: Never Used  Substance Use Topics  . Alcohol use: No    Alcohol/week: 0.0 standard drinks  . Drug use: No     Allergies   Penicillins, Azithromycin, Erythromycin, Influenza vaccines, Propylene glycol, and Tdap [tetanus-diphth-acell pertussis]   Review of Systems Review of Systems  HENT: Positive for ear pain.      Physical Exam Triage Vital Signs ED Triage Vitals  Enc Vitals Group     BP      Pulse      Resp      Temp      Temp  src      SpO2      Weight      Height      Head Circumference      Peak Flow      Pain Score      Pain Loc      Pain Edu?      Excl. in GC?    No data found.  Updated Vital Signs BP (!) 166/93 (BP Location: Left Arm)   Pulse 89   Temp 98 F (36.7 C) (Oral)   Resp 20   LMP 05/10/2011   SpO2 98%    Physical Exam Vitals and nursing note reviewed.  Constitutional:      Appearance: Normal appearance. She is normal weight.  HENT:     Head: Normocephalic.     Right Ear: Tympanic membrane, ear canal and external ear normal.     Left Ear: Tympanic membrane, ear canal and external ear normal.     Mouth/Throat:     Mouth: Mucous membranes are moist.     Pharynx: Oropharynx is clear.  Eyes:     Conjunctiva/sclera: Conjunctivae normal.  Cardiovascular:     Rate  and Rhythm: Normal rate.  Pulmonary:     Effort: Pulmonary effort is normal.  Musculoskeletal:        General: Normal range of motion.     Cervical back: Normal range of motion and neck supple.  Skin:    General: Skin is warm and dry.  Neurological:     General: No focal deficit present.     Mental Status: She is alert and oriented to person, place, and time.  Psychiatric:        Mood and Affect: Mood normal.        Behavior: Behavior normal.        Thought Content: Thought content normal.        Judgment: Judgment normal.      UC Treatments / Results  Labs (all labs ordered are listed, but only abnormal results are displayed) Labs Reviewed - No data to display  EKG   Radiology No results found.  Procedures Procedures (including critical care time)  Medications Ordered in UC Medications - No data to display  Initial Impression / Assessment and Plan / UC Course  I have reviewed the triage vital signs and the nursing notes.  Pertinent labs & imaging results that were available during my care of the patient were reviewed by me and considered in my medical decision making (see chart for details).    Final Clinical Impressions(s) / UC Diagnoses   Final diagnoses:  Sinus congestion  Otalgia of right ear     Discharge Instructions     Eardrum shows slight change consistent with pressure from the eustachian tubes and sinuses.  There is no redness or significant fluid behind the eardrum.  At this point I think the discomfort is coming from pressure and with your recent sinus congestion, this may very well be from seasonal change and/or allergies.  Usually we use an antihistamine in a situation like this and expect the symptoms to clear over the next couple days.    ED Prescriptions    None     I have reviewed the PDMP during this encounter.   Elvina Sidle, MD 01/20/20 [phone]

## 2020-03-02 DIAGNOSIS — U071 COVID-19: Secondary | ICD-10-CM

## 2020-03-02 HISTORY — DX: COVID-19: U07.1

## 2020-03-11 ENCOUNTER — Telehealth: Payer: Self-pay

## 2020-03-11 NOTE — Telephone Encounter (Signed)
Pt has VV tomorrow at 830.

## 2020-03-11 NOTE — Telephone Encounter (Signed)
[facility] Night - Client Nonclinical Telephone Record  [facility]urse Client [facility] Night - Client Client Site  [facility] [place] - Night Physician Raechel Ache - MD Contact Type Call Who Is Calling Patient / Member / Family / Caregiver Caller Name Abiha Lukehart Caller Phone Number [phone] Patient Name Sara Leonard Patient DOB 1960-12-13 Call Type Message Only Information Provided Reason for Call Request for General Office Information Initial Comment Caller states she has sinus issues going on with a headache. She wants to know if she should get tested for Covid. Caller declined triage, wants a message sent to the office. Disp. Time Disposition Final User 03/11/2020 7:56:53 AM General Information Provided Yes Mockler, Tiffany Call Closed By: Velta Addison Transaction Date/Time: 03/11/2020 7:53:27 AM (ET)

## 2020-03-12 ENCOUNTER — Other Ambulatory Visit: Payer: Self-pay

## 2020-03-12 ENCOUNTER — Encounter: Payer: Self-pay | Admitting: Family Medicine

## 2020-03-12 ENCOUNTER — Telehealth (INDEPENDENT_AMBULATORY_CARE_PROVIDER_SITE_OTHER): Payer: BC Managed Care – PPO | Admitting: Family Medicine

## 2020-03-12 DIAGNOSIS — J069 Acute upper respiratory infection, unspecified: Secondary | ICD-10-CM | POA: Diagnosis not present

## 2020-03-12 NOTE — Assessment & Plan Note (Addendum)
Story suggestive of viral sinusitis, however also could be COVID. Pt and husband both sick, both COVID tests done over weekend still pending. Update [place] with results. Discussed possible outpatient COVID treatments available, she is unsure if she would want to proceed with them depending on her symptoms at the time. Reviewed red flags to seek in-person care if COVID+, recommend self isolation until test results return. Discussed latest return to work guidelines.  Reviewed supportive care at home at this time.

## 2020-03-12 NOTE — Progress Notes (Signed)
Patient ID: Sara Leonard, female    DOB: 10-Sep-1960, 60 y.o.   MRN: [id number]  Virtual visit attempted through MyChart, a video enabled telemedicine application. Due to national recommendations of social distancing due to COVID-19, a virtual visit is felt to be most appropriate for this patient at this time. Reviewed limitations, risks, security and privacy concerns of performing a virtual visit and the availability of in person appointments. I also reviewed that there may be a patient responsible charge related to this service. The patient agreed to proceed.   Interactive audio and video telecommunications were attempted between myself and JULIEANN DRUMMONDS, however failed due to patient having technical difficulties OR patient not having access to video capability.  We continued and completed visit with audio only.  Time: 8:29am - 8:41am   Patient location: home Provider location: [place] at [facility], office Persons participating in this virtual visit: patient, provider   If any vitals were documented, they were collected by patient at home unless specified below.    BP 118/77   Pulse 87   Temp (!) 101 F (38.3 C)   Ht 4' 11.25" (1.505 m)   Wt 154 lb (69.9 kg)   LMP 05/10/2011   SpO2 97%   BMI 30.84 kg/m    CC: URI symptoms Subjective:   HPI: Sara Leonard is a 60 y.o. female presenting on 03/12/2020 for Sinus Problem (C/o sore throat, fever- max 101, sinus pressure, facial pain and drainage causing cough.  Sxs started 03/10/20.  Tried drinking a lot of water, helpful.  Went to be tested yesterday to be tested for COVID, results pending. )   First day of symptoms: 03/10/2020.  Tmax 101, sinus pressure/nasal congestion, facial pain to forehead and maxillary bilaterally with PNdrainage. Minimal cough productive of clear phlegm.  No ST at all. No loss of taste or smell, abd pain, nausea, diarrhea, body aches or fatigue. No dyspnea.   Treating with plenty of water, nasal  saline irritaion and PRN advil.  She was tested for COVID yesterday, results pending.   Husband sick with similar symptoms last week. He also tested for COVID - results also pending.  Has not received vaccine - states allergic to ingredients.   She is a Interior and spatial designer.  Known diet controlled diabetic.  Lab Results  Component Value Date   HGBA1C 6.5 11/17/2019  No h/o asthma or smoking.      Relevant past medical, surgical, family and social history reviewed and updated as indicated. Interim medical history since our last visit reviewed. Allergies and medications reviewed and updated. Outpatient Medications Prior to Visit  Medication Sig Dispense Refill  . hydrOXYzine (ATARAX/VISTARIL) 10 MG tablet Take 1 tablet (10 mg total) by mouth daily as needed. 20 tablet 0  . Lancets (ONETOUCH ULTRASOFT) lancets 1 each by Other route daily. Use as instructed 100 each 3  . omeprazole (PRILOSEC) 20 MG capsule TAKE 1-2 CAPSULES BY MOUTH DAILY 180 capsule 3  . ONETOUCH VERIO test strip CHECK BLOOD SUGAR ONCE DAILY AS DIRECTED 100 each 3  . triamcinolone cream (KENALOG) 0.1 % Apply 1 application topically 2 (two) times daily as needed. 453.6 g 0  . valACYclovir (VALTREX) 1000 MG tablet Take by mouth.     No facility-administered medications prior to visit.     Per HPI unless specifically indicated in ROS section below Review of Systems Objective:  BP 118/77   Pulse 87   Temp (!) 101 F (38.3 C)  Ht 4' 11.25" (1.505 m)   Wt 154 lb (69.9 kg)   LMP 05/10/2011   SpO2 97%   BMI 30.84 kg/m   Wt Readings from Last 3 Encounters:  03/12/20 154 lb (69.9 kg)  11/23/19 156 lb 4 oz (70.9 kg)  05/22/19 158 lb 6 oz (71.8 kg)       Physical exam: Pulm: speaks in complete sentences without increased work of breathing      Results for orders placed or performed in visit on 11/28/19  Fecal occult blood, imunochemical   Specimen: Stool  Result Value Ref Range   Fecal Occult Bld Negative Negative    Assessment & Plan:   Problem List Items Addressed This Visit    Acute upper respiratory infection    Story suggestive of viral sinusitis, however also could be COVID. Pt and husband both sick, both COVID tests done over weekend still pending. Update [place] with results. Discussed possible outpatient COVID treatments available, she is unsure if she would want to proceed with them depending on her symptoms at the time. Reviewed red flags to seek in-person care if COVID+, recommend self isolation until test results return. Discussed latest return to work guidelines.  Reviewed supportive care at home at this time.           No orders of the defined types were placed in this encounter.  No orders of the defined types were placed in this encounter.   I discussed the assessment and treatment plan with the patient. The patient was provided an opportunity to ask questions and all were answered. The patient agreed with the plan and demonstrated an understanding of the instructions. The patient was advised to call back or seek an in-person evaluation if the symptoms worsen or if the condition fails to improve as anticipated.  Follow up plan: Return if symptoms worsen or fail to improve.  Eustaquio Boyden, MD

## 2020-03-13 ENCOUNTER — Telehealth: Payer: Self-pay | Admitting: Family Medicine

## 2020-03-13 ENCOUNTER — Encounter: Payer: Self-pay | Admitting: Family Medicine

## 2020-03-13 NOTE — Telephone Encounter (Signed)
Returned pt's call.  Says she feels much better today.  Sxs started 02/07/21.  Reports no fever within last 24 hrs without fever-reducing med.  Still has occasional cough, which she says she has with sinus drainage.  Has had some nausea and a couple of episodes of diarrhea but both are better now.  She has really increased water intake.  Pt is asking when she can come out of quarantine.  Plz advise.

## 2020-03-13 NOTE — Telephone Encounter (Signed)
Patient had a phone call with Dr.G yesterday.  Dr. Reece Agar asked patient to call back with covid results.  Patient said her covid test came back positive. Patient has a few questions about the results.  Please call patient back.

## 2020-03-13 NOTE — Telephone Encounter (Addendum)
Symptoms started 03/10/2020?  Follow these current CDC guidelines for self-isolation: - Stay home for 5 days, starting the day after your symptoms (The first day is really day 0). - If you have no symptoms or your symptoms are resolving after 5 days, you can leave your house. - Continue to wear a mask around others for 5 additional days. **If you have a fever, continue to stay home until your fever resolves for 24 hours without fever-reducing medications.**  Here are some of the supportive therapies that may help with symptoms of a COVID-19 infection: A. Zinc Lozenge 75 to 100mg  daily B. Vitamin C 500mg  twice a day C. Vitamin D (Cholecalciferol) 2000 IU daily  Let know if ongoing nausea/diarrhea to consider medication for this. If worsening dyspnea recommend further evaluation.  Given DM and non covid vaccinated she may qualify for further outpatient treatment. Let know if desires referral for consideration.

## 2020-03-14 NOTE — Telephone Encounter (Signed)
Spoke confirming sxs start date.  States it was 03/03/20.  I relayed Dr. Timoteo Expose message.  Pt verbalizes understanding and that tomorrow should be her last day of quarantine and she will continue to wear her mask.  Pt expresses her thanks.

## 2020-03-21 NOTE — Telephone Encounter (Signed)
Given fever free and symptoms largely improving and she has completed 10 days since initial diagnosis, ok to return to work without retesting. No longer contagious. Do still recommend mask use when around others as general precaution.

## 2020-03-21 NOTE — Telephone Encounter (Signed)
Called patient and updated her of this information. Patient stated that she was tested on 1/10 and received her results on 1/12. Otherwise, she is feeling much better.

## 2020-03-21 NOTE — Telephone Encounter (Signed)
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 No SOB,H/A,diarrhea,vomiting, loss of taste or smell, runny nose or S/T. No fever since 03/15/19 with no fever reducing meds. Pt said no symptoms since day 5 except on and off cough with tickle at back of throat due to sinus like drainage. Pt is wanting to go back to work but some of her customers want pt to be retested with neg covid test before returning to work; pt has heard can keep positive covid test even after over covid. Pt wants to know if needs to be retested for covid or can pt return to work. Pt request cb.

## 2020-04-16 ENCOUNTER — Telehealth: Payer: Self-pay | Admitting: Family Medicine

## 2020-04-16 NOTE — Telephone Encounter (Signed)
I would assume this is possible but more likely that a person would catch 2 different variants.  Thanks.

## 2020-04-16 NOTE — Telephone Encounter (Signed)
Patient aware of message below.

## 2020-04-16 NOTE — Telephone Encounter (Signed)
Patient wants to know if you can catch the same covid variant twice? Please advise. EM

## 2020-05-07 ENCOUNTER — Other Ambulatory Visit: Payer: BC Managed Care – PPO

## 2020-05-13 ENCOUNTER — Encounter: Payer: Self-pay | Admitting: Family Medicine

## 2020-05-13 ENCOUNTER — Ambulatory Visit (INDEPENDENT_AMBULATORY_CARE_PROVIDER_SITE_OTHER): Payer: BC Managed Care – PPO | Admitting: Family Medicine

## 2020-05-13 ENCOUNTER — Other Ambulatory Visit: Payer: Self-pay

## 2020-05-13 VITALS — BP 144/82 | HR 91 | Temp 97.9°F | Ht 59.0 in | Wt 156.0 lb

## 2020-05-13 DIAGNOSIS — Z8639 Personal history of other endocrine, nutritional and metabolic disease: Secondary | ICD-10-CM

## 2020-05-13 DIAGNOSIS — E119 Type 2 diabetes mellitus without complications: Secondary | ICD-10-CM

## 2020-05-13 DIAGNOSIS — R001 Bradycardia, unspecified: Secondary | ICD-10-CM | POA: Diagnosis not present

## 2020-05-13 LAB — POCT GLYCOSYLATED HEMOGLOBIN (HGB A1C): Hemoglobin A1C: 6 % — AB (ref 4.0–5.6)

## 2020-05-13 NOTE — Progress Notes (Unsigned)
This visit occurred during the SARS-CoV-2 public health emergency.  Safety protocols were in place, including screening questions prior to the visit, additional usage of staff PPE, and extensive cleaning of exam room while observing appropriate contact time as indicated for disinfecting solutions.  Diabetes:  No meds.   Hypoglycemic episodes: only if prolonged fasting, cautions d/w pt.   Hyperglycemic episodes: no Feet problems: no Blood Sugars averaging: rarely checked, occ down to 80s.   A1c d/w pt at OV.  A1c 6.   She is working on diet and exercise.   BP was 120/73 at home.  She likely has white coat HTN.  She had a mild case of covid.  D/w pt.  She was advised not to take vaccine given h/o propylene glycol allergy.  Discussed.  She had one episode where she noted bradycardia on her watch, down into the 40s.  She is able to exercise without trouble otherwise.  She not having chest pain or shortness of breath.  She walked 5 miles recently without trouble.  This was a single episode and I am uncertain if she had accurate pulse recording on the watch.  Discussed.  If this were a positional issue where the watch was not picking up an adequate tracing, it may have artificially depressed her heart rate.  I asked her to monitor this and check her pulse manually if she ever saw any other episode of bradycardia on her watch.  She agreed  Meds, vitals, and allergies reviewed.  ROS: Per HPI unless specifically indicated in ROS section   GEN: nad, alert and oriented HEENT: ncat NECK: supple w/o LA CV: rrr. PULM: ctab, no inc wob ABD: soft, +bs EXT: no edema SKIN: no acute rash

## 2020-05-13 NOTE — Patient Instructions (Addendum)
Thank you for your effort.   Plan on recheck at a physical in the fall, in about 6 months.  Take care.  Glad to see you.

## 2020-05-15 DIAGNOSIS — R001 Bradycardia, unspecified: Secondary | ICD-10-CM | POA: Insufficient documentation

## 2020-05-15 NOTE — Assessment & Plan Note (Signed)
She had one episode where she noted bradycardia on her watch, down into the 40s.  She is able to exercise without trouble otherwise.  She not having chest pain or shortness of breath.  She walked 5 miles recently without trouble.  This was a single episode and I am uncertain if she had accurate pulse recording on the watch.  Discussed.  If this were a positional issue where the watch was not picking up an adequate tracing, it may have artificially depressed her heart rate.  I asked her to monitor this and check her pulse manually if she ever saw any other episode of bradycardia on her watch.  She agreed

## 2020-05-15 NOTE — Assessment & Plan Note (Addendum)
Blood pressure controlled on home check.  A1c down to 6.  She is working on diet and exercise.  Recheck periodically.  She agrees to plan.  I thanked her for her effort.

## 2020-10-15 ENCOUNTER — Telehealth: Payer: Self-pay | Admitting: *Deleted

## 2020-10-15 NOTE — Telephone Encounter (Signed)
PLEASE NOTE: All timestamps contained within this report are represented as [place] Standard Time. CONFIDENTIALTY NOTICE: This fax transmission is intended only for the addressee. It contains information that is legally privileged, confidential or otherwise protected from use or disclosure. If you are not the intended recipient, you are strictly prohibited from reviewing, disclosing, copying using or disseminating any of this information or taking any action in reliance on or regarding this information. If you have received this fax in error, please notify us immediately by telephone so that we can arrange for its return to [place]. Phone: [phone], Toll-Free: [phone], Fax: [phone] Page: 1 of 1 Call Id: [id number] [facility] Day - Client TELEPHONE ADVICE RECORD AccessNurse Patient Name: Sara Leonard Gender: Female DOB: 12/17/1960 Age: 60 Y 9 M 26 D Return Phone Number: [phone] (Primary), [phone] (Secondary) Address: City/ State/ Zip: [place]  [address] Client [facility] Day - Client Client Site [facility] [place] - Day Physician Raechel Ache - MD Contact Type Call Who Is Calling Patient / Member / Family / Caregiver Call Type Triage / Clinical Relationship To Patient Self Return Phone Number [phone] (Secondary) Chief Complaint Headache Reason for Call Symptomatic / Request for Health Information Initial Comment Caller states she has been exposed to covid by her mother. PT has cough and headaches. No appts available at the office. Translation No Nurse Assessment Nurse: Self, RN, Herbert Seta Date/Time (Eastern Time): 10/15/2020 11:32:35 AM Confirm and document reason for call. If symptomatic, describe symptoms. ---Caller has questions about Quarantine since Mom has Covid . Caller say they are going out of Town. Caller has question about when they can travel , Caller has No symptoms and  hasn't been near her mother in 5 days. Caller was instructed it was not necessary for her to quarantine. Per CDC guidelines. Caller declined triage since no current s/s Does the patient have any new or worsening symptoms? ---No Disp. Time Lamount Cohen Time) Disposition Final User 10/15/2020 11:40:52 AM Clinical Call Yes Self, RN, Herbert Seta

## 2020-10-15 NOTE — Telephone Encounter (Signed)
Spoke to patient by telephone and was advised that she is not having any symptoms and does not know why it was noted that she did. Patient stated that her mom has had covid and patient just wanted the guidelines for isolation. Patient stated that the access nurse reviewed the isolation guidelines with her. Patient was ago advised of the rules set by the CDC. Patient appreciated that call.

## 2020-10-15 NOTE — Telephone Encounter (Signed)
Noted. Thanks.

## 2020-10-18 ENCOUNTER — Telehealth: Payer: Self-pay

## 2020-10-18 ENCOUNTER — Other Ambulatory Visit: Payer: Self-pay

## 2020-10-18 ENCOUNTER — Ambulatory Visit: Payer: BC Managed Care – PPO | Admitting: Family Medicine

## 2020-10-18 ENCOUNTER — Encounter: Payer: Self-pay | Admitting: Family Medicine

## 2020-10-18 VITALS — BP 122/74 | HR 93 | Temp 97.1°F | Ht 59.0 in | Wt 160.0 lb

## 2020-10-18 DIAGNOSIS — Z8639 Personal history of other endocrine, nutritional and metabolic disease: Secondary | ICD-10-CM | POA: Diagnosis not present

## 2020-10-18 DIAGNOSIS — R739 Hyperglycemia, unspecified: Secondary | ICD-10-CM | POA: Diagnosis not present

## 2020-10-18 DIAGNOSIS — E559 Vitamin D deficiency, unspecified: Secondary | ICD-10-CM

## 2020-10-18 DIAGNOSIS — L989 Disorder of the skin and subcutaneous tissue, unspecified: Secondary | ICD-10-CM

## 2020-10-18 LAB — COMPREHENSIVE METABOLIC PANEL
ALT: 16 U/L (ref 0–35)
AST: 18 U/L (ref 0–37)
Albumin: 4.4 g/dL (ref 3.5–5.2)
Alkaline Phosphatase: 83 U/L (ref 39–117)
BUN: 16 mg/dL (ref 6–23)
CO2: 29 mEq/L (ref 19–32)
Calcium: 10.2 mg/dL (ref 8.4–10.5)
Chloride: 103 mEq/L (ref 96–112)
Creatinine, Ser: 0.84 mg/dL (ref 0.40–1.20)
GFR: 75.32 mL/min (ref >60.00)
Glucose, Bld: 140 mg/dL — ABNORMAL HIGH (ref 70–99)
Potassium: 4.9 mEq/L (ref 3.5–5.1)
Sodium: 140 mEq/L (ref 135–145)
Total Bilirubin: 0.5 mg/dL (ref 0.2–1.2)
Total Protein: 7.2 g/dL (ref 6.0–8.3)

## 2020-10-18 LAB — CBC WITH DIFFERENTIAL/PLATELET
Basophils Absolute: 0 10*3/uL (ref 0.0–0.1)
Basophils Relative: 0.4 % (ref 0.0–3.0)
Eosinophils Absolute: 0.3 10*3/uL (ref 0.0–0.7)
Eosinophils Relative: 5.9 % — ABNORMAL HIGH (ref 0.0–5.0)
HCT: 41.6 % (ref 36.0–46.0)
Hemoglobin: 14 g/dL (ref 12.0–15.0)
Lymphocytes Relative: 28.4 % (ref 12.0–46.0)
Lymphs Abs: 1.7 10*3/uL (ref 0.7–4.0)
MCHC: 33.7 g/dL (ref 30.0–36.0)
MCV: 85.6 fl (ref 78.0–100.0)
Monocytes Absolute: 0.4 10*3/uL (ref 0.1–1.0)
Monocytes Relative: 7 % (ref 3.0–12.0)
Neutro Abs: 3.4 10*3/uL (ref 1.4–7.7)
Neutrophils Relative %: 58.3 % (ref 43.0–77.0)
Platelets: 241 10*3/uL (ref 150.0–400.0)
RBC: 4.86 Mil/uL (ref 3.87–5.11)
RDW: 12.9 % (ref 11.5–15.5)
WBC: 5.9 10*3/uL (ref 4.0–10.5)

## 2020-10-18 LAB — LIPID PANEL
Cholesterol: 202 mg/dL — ABNORMAL HIGH (ref 0–200)
HDL: 56.4 mg/dL (ref >39.00)
LDL Cholesterol: 135 mg/dL — ABNORMAL HIGH (ref 0–99)
NonHDL: 145.1
Total CHOL/HDL Ratio: 4
Triglycerides: 50 mg/dL (ref 0.0–149.0)
VLDL: 10 mg/dL (ref 0.0–40.0)

## 2020-10-18 LAB — TSH: TSH: 1.79 u[IU]/mL (ref 0.35–5.50)

## 2020-10-18 LAB — HEMOGLOBIN A1C: Hgb A1c MFr Bld: 6.5 % (ref 4.6–6.5)

## 2020-10-18 LAB — VITAMIN D 25 HYDROXY (VIT D DEFICIENCY, FRACTURES): VITD: 40.34 ng/mL (ref 30.00–100.00)

## 2020-10-18 MED ORDER — TRIAMCINOLONE ACETONIDE 0.1 % EX CREA: 1.0000 "application " | TOPICAL_CREAM | Freq: Two times a day (BID) | CUTANEOUS | 1 refills | Status: AC | PRN

## 2020-10-18 NOTE — Telephone Encounter (Signed)
Will d/w pt at OV

## 2020-10-18 NOTE — Telephone Encounter (Signed)
Per appt notes pt is already arrived for appt. Sending note to Dr Para March and Shanda Bumps CMA. Sending teams to [facility].

## 2020-10-18 NOTE — Progress Notes (Signed)
This visit occurred during the SARS-CoV-2 public health emergency.  Safety protocols were in place, including screening questions prior to the visit, additional usage of staff PPE, and extensive cleaning of exam room while observing appropriate contact time as indicated for disinfecting solutions.  Covid exposure.  Husband and son were sick.  She has no sx.  Cautions d/w pt. she can update [place] as needed.  Reddish area on the R upper chest wall.  Present for about 2 weeks.  No FCNAVD.  Not painful.  Not itchy.  "Just a little red place."  She has routine derm f/u yearly but couldn't get in for a visit in the near future.    Meds, vitals, and allergies reviewed.   ROS: Per HPI unless specifically indicated in ROS section   Nad Ncat rrr 5x8 mm R upper chest wall.  Blanches. No ulceration.  Looks like a benign irritated superficial lesion.    Labs collected for upcoming yearly visit.  See notes on labs.

## 2020-10-18 NOTE — Patient Instructions (Addendum)
Go to the lab on the way out.   If you have mychart we'll likely use that to update you.    Take care.  Glad to see you.  Use a little TAC cream on the area and it should gradually heal over.  If it doesn't in the next 2 weeks, then let me know.

## 2020-10-18 NOTE — Telephone Encounter (Signed)
[facility] Night - Client TELEPHONE ADVICE RECORD Access[facility]urse Patient Name: Gabrielly AN DREWS Gender: Female DOB: 04/16/1960 Age: 60 Y 9 M 29 D Return Phone Number: [phone] (Primary), [phone] (Secondary) Address: City/ State/ Zip: [place] [address] Client [facility] Night - Client Client Site [facility] [place] - Night Physician Raechel Ache - MD Contact Type Call Who Is Calling Patient / Member / Family / Caregiver Call Type Triage / Clinical Relationship To Patient Self Return Phone Number [phone] (Primary) Chief Complaint Infection Exposure (non-symptomatic) Reason for Call Symptomatic / Request for Health Information Initial Comment Caller states her husband has been feeling bad and later tested positive for COVID. Caller states she has an appointment schedule and she would like to know if she should cancel her appointment. Translation No Nurse Assessment Nurse: Risa Grill, RN, Lambert Mody Date/Time (Eastern Time): 10/18/2020 7:14:52 AM Confirm and document reason for call. If symptomatic, describe symptoms. ---Caller states her husband has been feeling bad and later tested positive for COVID. Caller states she has an appointment schedule and she would like to know if she should cancel her appointment. no fever or current sx Does the patient have any new or worsening symptoms? ---Yes Will a triage be completed? ---Yes Related visit to physician within the last 2 weeks? ---No Does the PT have any chronic conditions? (i.e. diabetes, asthma, this includes High risk factors for pregnancy, etc.) ---No Is this a behavioral health or substance abuse call? ---No Guidelines Guideline Title Affirmed Question Affirmed Notes Nurse Date/Time (Eastern Time) COVID-19 - Exposure [1] CLOSE CONTACT COVID-19 EXPOSURE within last 14 days AND [2] NO symptoms Risa Grill, RN, Tracie 10/18/2020  7:16:02 AM Disp. Time Lamount Cohen Time) Disposition Final User PLEASE NOTE: All timestamps contained within this report are represented as [place] Standard Time. CONFIDENTIALTY NOTICE: This fax transmission is intended only for the addressee. It contains information that is legally privileged, confidential or otherwise protected from use or disclosure. If you are not the intended recipient, you are strictly prohibited from reviewing, disclosing, copying using or disseminating any of this information or taking any action in reliance on or regarding this information. If you have received this fax in error, please notify us immediately by telephone so that we can arrange for its return to [place]. Phone: [phone], Toll-Free: [phone], Fax: [phone] Page: 2 of 2 Call Id: [id number] 10/18/2020 7:22:21 AM Home Care Yes Risa Grill, RN, Cyndia Diver Disagree/Comply Comply Caller Understands Yes PreDisposition Call Doctor Care Advice Given Per Guideline HOME CARE: * You should be able to treat this at home. * WEAR A MASK: Wear a well-fitted mask for 10 full days any time you are around others inside your home or in public. Do not go to places where you are unable to wear a mask. * WATCH FOR SYMPTOMS: Watch for symptoms of COVID-19 until 14 days after you last had close contact with someone with COVID-19. * GET TESTED: Get tested at least 5 days after you last had close contact with someone with COVID-19. REASSURANCE AND EDUCATION - UNVACCINATED OR NOT UP-TO-DATE ON YOUR COVID-19 VACCINATIONS - COVID-19 EXPOSURE AND NO SYMPTOMS: * WEAR A MASK: Wear a well-fitted mask for 10 full days any time you are around others inside your home or in public. Do not go to places where you are unable to wear a mask. * WATCH FOR SYMPTOMS: Watch for symptoms of COVID-19 until 14 days after you last had close contact with someone with COVID-19. *  QUARANTINE: Stay home and quarantine for at least 5 full days. COVID-19 -  SYMPTOMS: * COVID-19 most often causes a respiratory illness. * The most common symptoms are: cough, fever, and shortness of breath. * Other less common symptoms are: chills, fatigue, headache, loss of smell or taste, muscle pain, and sore throat. * Some people may have minimal symptoms or even have no symptoms (asymptomatic). CARE ADVICE given per COVID-19 - Exposure (Adult) guideline. CALL BACK IF: * Fever or feeling feverish occurs within 14 days of COVID-19 exposure * Cough or difficulty breathing occur within 14 days of COVID-19 exposure * Other symptoms you think might be from COVID-19 occur * You have more questions Comments User: Theresa Mulligan, RN Date/Time Lamount Cohen Time): 10/18/2020 7:15:47 AM appt at 0830 this morning with office User: Theresa Mulligan, RN Date/Time Lamount Cohen Time): 10/18/2020 7:17:54 AM no vaccines User: Theresa Mulligan, RN Date/Time (Eastern Time): 10/18/2020 7:22:00 AM please call Joss and let her know if she needs to cancel 0830 appt. It takes her 20 min to get to office

## 2020-10-20 DIAGNOSIS — L989 Disorder of the skin and subcutaneous tissue, unspecified: Secondary | ICD-10-CM | POA: Insufficient documentation

## 2020-10-20 NOTE — Assessment & Plan Note (Signed)
5x8 mm R upper chest wall.  Blanches. No ulceration.  Looks like a benign irritated superficial lesion.  It does not have a rolled border and it does not have telangiectatic vessels.  Its not hyperpigmented.  Discussed options I would use a little triamcinolone cream on the area and it should gradually heal over.  If it doesn't in the next 2 weeks, then I asked her to let me know.

## 2020-11-05 ENCOUNTER — Other Ambulatory Visit: Payer: BC Managed Care – PPO

## 2020-11-11 ENCOUNTER — Other Ambulatory Visit: Payer: Self-pay

## 2020-11-11 ENCOUNTER — Encounter: Payer: Self-pay | Admitting: Family Medicine

## 2020-11-11 ENCOUNTER — Ambulatory Visit (INDEPENDENT_AMBULATORY_CARE_PROVIDER_SITE_OTHER): Payer: Self-pay | Admitting: Family Medicine

## 2020-11-11 ENCOUNTER — Ambulatory Visit: Payer: Self-pay

## 2020-11-11 VITALS — BP 130/86 | HR 80 | Temp 97.4°F | Ht 59.0 in | Wt 160.0 lb

## 2020-11-11 DIAGNOSIS — Z1211 Encounter for screening for malignant neoplasm of colon: Secondary | ICD-10-CM

## 2020-11-11 DIAGNOSIS — K8020 Calculus of gallbladder without cholecystitis without obstruction: Secondary | ICD-10-CM

## 2020-11-11 DIAGNOSIS — Z7189 Other specified counseling: Secondary | ICD-10-CM

## 2020-11-11 DIAGNOSIS — D509 Iron deficiency anemia, unspecified: Secondary | ICD-10-CM

## 2020-11-11 DIAGNOSIS — K219 Gastro-esophageal reflux disease without esophagitis: Secondary | ICD-10-CM

## 2020-11-11 DIAGNOSIS — E119 Type 2 diabetes mellitus without complications: Secondary | ICD-10-CM

## 2020-11-11 DIAGNOSIS — Z0000 Encounter for general adult medical examination without abnormal findings: Secondary | ICD-10-CM

## 2020-11-11 MED ORDER — OMEPRAZOLE 20 MG PO CPDR: DELAYED_RELEASE_CAPSULE | ORAL | 3 refills | Status: DC

## 2020-11-11 MED ORDER — ONETOUCH ULTRASOFT LANCETS MISC: 1.0000 | Freq: Every day | 3 refills | Status: AC

## 2020-11-11 MED ORDER — ONETOUCH VERIO VI STRP: ORAL_STRIP | 3 refills | Status: DC

## 2020-11-11 NOTE — Assessment & Plan Note (Signed)
Tetanus 2006- she describes sig local injection site reaction.  She doesn't have high risk exposures (ie young kids for tdap).  Defer.  Discussed. PNA and shingles d/w pt.   Flu shot not indicated due to prev reaction. covid vaccine d/w pt. Mammogram and breast exam, per gyn.  d/w pt.   Pap per gyn. D/w pt.  I'll defer.   D/w patient re:options for colon cancer screening, including IFOB vs. colonoscopy.  Risks and benefits of both were discussed and patient voiced understanding.  Pt elects for: IFOB.   Living will. D/w pt. Would have her husband designated if patient were incapacitated.   Diet and exercise d/w pt.   HIV testing prev neg during her pregnancy in 1994.   HCV neg prev.  

## 2020-11-11 NOTE — Progress Notes (Signed)
This visit occurred during the SARS-CoV-2 public health emergency.  Safety protocols were in place, including screening questions prior to the visit, additional usage of staff PPE, and extensive cleaning of exam room while observing appropriate contact time as indicated for disinfecting solutions.  CPE- See plan.  Routine anticipatory guidance given to patient.  See health maintenance.  The possibility exists that previously documented standard health maintenance information may have been brought forward from a previous encounter into this note.  If needed, that same information has been updated to reflect the current situation based on today's encounter.    Tetanus 2006- she describes sig local injection site reaction.  She doesn't have high risk exposures (ie young kids for tdap).  Defer.  Discussed. PNA and shingles d/w pt.   Flu shot not indicated due to prev reaction. covid vaccine d/w pt. Mammogram and breast exam, per gyn.  d/w pt.   Pap per gyn. D/w pt.  I'll defer.   D/w patient [id number] for colon cancer screening, including IFOB vs. colonoscopy.  Risks and benefits of both were discussed and patient voiced understanding.  Pt elects for: IFOB.   Living will. D/w pt. Would have her husband designated if patient were incapacitated.   Diet and exercise d/w pt.   HIV testing prev neg during her pregnancy in 1994.   HCV neg prev.   BP controlled on home checks, d/w pt.   She wanted to defer cholecystectomy for now.  She has no symptoms with a low-fat diet.  Routine cautions given.  If she has any right upper quadrant pain that would prompt evaluation, discussed with patient.    H/o DM2 d/w pt.  A1c 6.5 recently.  No meds.  D/w pt about diet and exercise.  Statin/PNA/MALB indication may not be an issue is she isn't diabetic on recheck.  Sugar is usually ~120, occ higher.     Hx anemia.  off iron.  Cbc wnl, d/w pt.     GERD, controlled on prilosec.  Used about 2 times in a week, sometimes  less.  She avoids triggers.  She just sold her beach house.  She had a lot going on.     PMH and SH reviewed  Meds, vitals, and allergies reviewed.   ROS: Per HPI.  Unless specifically indicated otherwise in HPI, the patient denies:  General: fever. Eyes: acute vision changes ENT: sore throat Cardiovascular: chest pain Respiratory: SOB GI: vomiting GU: dysuria Musculoskeletal: acute back pain Derm: acute rash Neuro: acute motor dysfunction Psych: worsening mood Endocrine: polydipsia Heme: bleeding Allergy: hayfever  GEN: nad, alert and oriented HEENT: ncat NECK: supple w/o LA CV: rrr. PULM: ctab, no inc wob ABD: soft, +bs EXT: no edema SKIN: no acute rash, no residual lesion on the R chest wall.    Diabetic foot exam: Normal inspection No skin breakdown No calluses  Normal DP pulses Normal sensation to light touch and monofilament Nails normal

## 2020-11-11 NOTE — Assessment & Plan Note (Signed)
A1c 6.5 recently.  No meds.  D/w pt about diet and exercise.  Statin/PNA/MALB indication may not be an issue is she isn't diabetic on recheck.  Sugar is usually ~120, occ higher.  She'll work on diet and exercise and recheck in about 6 months.  No change in meds now.  She agrees.

## 2020-11-11 NOTE — Assessment & Plan Note (Signed)
GERD, controlled on prilosec.  Used about 2 times in a week, sometimes less.  She avoids triggers. Continue prn use.

## 2020-11-11 NOTE — Assessment & Plan Note (Signed)
H/o. Is off iron.  Cbc wnl, d/w pt.  would continue as is.  She agrees.

## 2020-11-11 NOTE — Assessment & Plan Note (Signed)
She wanted to defer cholecystectomy for now.  She has no symptoms with a low-fat diet.  Routine cautions given.  If she has any right upper quadrant pain that would prompt evaluation, discussed with patient.  

## 2020-11-11 NOTE — Patient Instructions (Addendum)
Check with your pharmacy about the shingles and pneumonia shots given your history.    Recheck in about 6 months with A1c at the visit.    Go to the lab on the way out.   If you have mychart we'll likely use that to update you.     Take care.  Glad to see you.

## 2020-11-11 NOTE — Assessment & Plan Note (Signed)
Living will. D/w pt. Would have her husband designated if patient were incapacitated.   

## 2020-11-13 ENCOUNTER — Other Ambulatory Visit (INDEPENDENT_AMBULATORY_CARE_PROVIDER_SITE_OTHER): Payer: Self-pay

## 2020-11-13 DIAGNOSIS — Z1211 Encounter for screening for malignant neoplasm of colon: Secondary | ICD-10-CM

## 2020-11-13 LAB — FECAL OCCULT BLOOD, IMMUNOCHEMICAL: Fecal Occult Bld: NEGATIVE

## 2021-02-26 ENCOUNTER — Encounter: Payer: Self-pay | Admitting: Family

## 2021-02-26 ENCOUNTER — Telehealth (INDEPENDENT_AMBULATORY_CARE_PROVIDER_SITE_OTHER): Payer: BC Managed Care – PPO | Admitting: Family

## 2021-02-26 VITALS — HR 79 | Temp 97.7°F | Ht 59.0 in | Wt 156.0 lb

## 2021-02-26 DIAGNOSIS — U071 COVID-19: Secondary | ICD-10-CM

## 2021-02-26 DIAGNOSIS — R0981 Nasal congestion: Secondary | ICD-10-CM | POA: Diagnosis not present

## 2021-02-28 NOTE — Progress Notes (Signed)
° °  Virtual Visit via Video   I connected with patient on 02/28/21 at 11:40 AM EST by a video enabled telemedicine application and verified that I am speaking with the correct person using two identifiers.  Location patient: Home Location provider: [employer], Office Persons participating in the virtual visit: Patient, Provider, CMA  I discussed the limitations of evaluation and management by telemedicine and the availability of in person appointments. The patient expressed understanding and agreed to proceed.  Subjective:   HPI:   60 year old female presents via video visit with c/o sinus congestion, stuffiness, fever that has resolved x 3 days. She originally tested for COVID 4 days ago and was negative. However, yesterday she was positive. She actually feels better now and her fever has resolved. She also has her taste and smell back that was also a concern. Overall, to she is much better.   ROS:   See pertinent positives and negatives per HPI.  Patient Active Problem List   Diagnosis Date Noted   Bradycardia 05/15/2020   White coat syndrome without diagnosis of hypertension 10/26/2016   Calculus of gallbladder without cholecystitis without obstruction 02/16/2015   Hepatomegaly 02/12/2015   Advance care planning 05/22/2014   Diabetes mellitus without complication (HCC) 05/22/2014   Vitamin D deficiency 05/22/2014   Routine general medical examination at a health care facility 08/01/2012   Knee internal derangement 10/13/2010   HYPERCHOLESTEROLEMIA 09/26/2008   ANEMIA, IRON DEFICIENCY 09/26/2008   Anxiety state 09/26/2008   GERD 09/26/2008    Social History   Tobacco Use   Smoking status: Never   Smokeless tobacco: Never  Substance Use Topics   Alcohol use: No    Alcohol/week: 0.0 standard drinks    Current Outpatient Medications:    glucose blood (ONETOUCH VERIO) test strip, CHECK BLOOD SUGAR ONCE DAILY AS DIRECTED, Disp: 100 each, Rfl: 3   Lancets  (ONETOUCH ULTRASOFT) lancets, 1 each by Other route daily. Use as instructed, Disp: 100 each, Rfl: 3   omeprazole (PRILOSEC) 20 MG capsule, TAKE 1-2 CAPSULES BY MOUTH DAILY, Disp: 180 capsule, Rfl: 3   triamcinolone cream (KENALOG) 0.1 %, Apply 1 application topically 2 (two) times daily as needed., Disp: 45 g, Rfl: 1  Allergies  Allergen Reactions   Penicillins     REACTION: rash   Azithromycin    Erythromycin Other (See Comments)    GI upset   Influenza Vaccines     Local reaction, sig local erythema.     Propylene Glycol     rash   Tdap [Tetanus-Diphth-Acell Pertussis] Other (See Comments)    Injection site reaction    Objective:   Pulse 79    Temp 97.7 F (36.5 C) (Temporal)    Ht 4\' 11"  (1.499 m)    Wt 156 lb (70.8 kg)    LMP 05/10/2011    SpO2 98%    BMI 31.51 kg/m   Patient is well-developed, well-nourished in no acute distress.  Resting comfortably  at home.  Head is normocephalic, atraumatic.  No labored breathing.  Speech is clear and coherent with logical content.  Patient is alert and oriented at baseline.    Assessment and Plan:    Javen was seen today for acute visit.  Diagnoses and all orders for this visit:  COVID-19  Nasal sinus congestion    Call the office if symptoms worsen or persist. Recheck as scheduled and sooner as needed.   Cordelia Pen, FNP 02/28/2021

## 2021-04-22 ENCOUNTER — Ambulatory Visit
Admission: EM | Admit: 2021-04-22 | Discharge: 2021-04-22 | Disposition: A | Discharge status: Home / Self Care | Payer: BC Managed Care – PPO | Attending: Internal Medicine | Admitting: Internal Medicine

## 2021-04-22 ENCOUNTER — Encounter: Payer: Self-pay | Admitting: Emergency Medicine

## 2021-04-22 ENCOUNTER — Other Ambulatory Visit: Payer: Self-pay

## 2021-04-22 DIAGNOSIS — J069 Acute upper respiratory infection, unspecified: Secondary | ICD-10-CM

## 2021-04-22 MED ORDER — FLUTICASONE PROPIONATE 50 MCG/ACT NA SUSP: 1.0000 | Freq: Every day | NASAL | 0 refills | Status: DC

## 2021-04-22 NOTE — ED Triage Notes (Signed)
Pt here for nasal congestion and cough x 5 days; pt with negative covid test

## 2021-04-22 NOTE — ED Provider Notes (Signed)
[facility]    CSN: [id number] Arrival date & time: 04/22/21  1403      History   Chief Complaint Chief Complaint  Patient presents with   Nasal Congestion    HPI Sara Leonard is a 61 y.o. female.   Patient presents with nasal congestion and mild nonproductive cough that has been present for approximately 5 days.  Patient denies any known fevers or sick contacts.  Denies chest pain, shortness of breath, sore throat, ear pain, nausea, vomiting, diarrhea, abdominal pain.  Patient took an at home COVID test that was negative.  Patient has taken over-the-counter cold and flu medications with some improvement in symptoms.    Past Medical History:  Diagnosis Date   Anxiety    COVID-19 virus infection 03/2020   GERD (gastroesophageal reflux disease)    Iron deficiency    long standing, + FH and predates onset of menses   White coat hypertension     Patient Active Problem List   Diagnosis Date Noted   Bradycardia 05/15/2020   White coat syndrome without diagnosis of hypertension 10/26/2016   Calculus of gallbladder without cholecystitis without obstruction 02/16/2015   Hepatomegaly 02/12/2015   Advance care planning 05/22/2014   Diabetes mellitus without complication (HCC) 05/22/2014   Vitamin D deficiency 05/22/2014   Routine general medical examination at a health care facility 08/01/2012   Knee internal derangement 10/13/2010   HYPERCHOLESTEROLEMIA 09/26/2008   ANEMIA, IRON DEFICIENCY 09/26/2008   Anxiety state 09/26/2008   GERD 09/26/2008    Past Surgical History:  Procedure Laterality Date   CESAREAN SECTION     x2   TONSILLECTOMY      OB History   No obstetric history on file.      Home Medications    Prior to Admission medications   Medication Sig Start Date End Date Taking? Authorizing Provider  fluticasone (FLONASE) 50 MCG/ACT nasal spray Place 1 spray into both nostrils daily for 3 days. 04/22/21 04/25/21 Yes Luiscarlos Kaczmarczyk, Acie Fredrickson, FNP   glucose blood (ONETOUCH VERIO) test strip CHECK BLOOD SUGAR ONCE DAILY AS DIRECTED 11/11/20   Joaquim Nam, MD  Lancets [facility] ULTRASOFT) lancets 1 each by Other route daily. Use as instructed 11/11/20   Joaquim Nam, MD  omeprazole (PRILOSEC) 20 MG capsule TAKE 1-2 CAPSULES BY MOUTH DAILY 11/11/20   Joaquim Nam, MD  triamcinolone cream (KENALOG) 0.1 % Apply 1 application topically 2 (two) times daily as needed. 10/18/20   Joaquim Nam, MD    Family History Family History  Problem Relation Age of Onset   Diabetes Mother    Hypertension Mother    Coronary artery disease Father    Heart attack Father    Hypertension Father    Hyperlipidemia Father    Kidney disease Father    Diabetes Father    Colon cancer Neg Hx    Breast cancer Neg Hx     Social History Social History   Tobacco Use   Smoking status: Never   Smokeless tobacco: Never  Vaping Use   Vaping Use: Never used  Substance Use Topics   Alcohol use: No    Alcohol/week: 0.0 standard drinks   Drug use: No     Allergies   Penicillins, Azithromycin, Erythromycin, Influenza vaccines, Propylene glycol, and Tdap [tetanus-diphth-acell pertussis]   Review of Systems Review of Systems Per HPI  Physical Exam Triage Vital Signs ED Triage Vitals  Enc Vitals Group     BP 04/22/21  1429 (!) 166/83     Pulse Rate 04/22/21 1429 (!) 110     Resp 04/22/21 1429 18     Temp 04/22/21 1429 99.4 F (37.4 C)     Temp Source 04/22/21 1429 Oral     SpO2 04/22/21 1429 99 %     Weight --      Height --      Head Circumference --      Peak Flow --      Pain Score 04/22/21 1430 3     Pain Loc --      Pain Edu? --      Excl. in GC? --    No data found.  Updated Vital Signs BP (!) 166/83 (BP Location: Left Arm)    Pulse (!) 110    Temp 99.4 F (37.4 C) (Oral)    Resp 18    LMP 05/10/2011    SpO2 99%   Visual Acuity Right Eye Distance:   Left Eye Distance:   Bilateral Distance:    Right Eye Near:   Left  Eye Near:    Bilateral Near:     Physical Exam Constitutional:      General: She is not in acute distress.    Appearance: Normal appearance. She is not toxic-appearing or diaphoretic.  HENT:     Head: Normocephalic and atraumatic.     Right Ear: Tympanic membrane and ear canal normal.     Left Ear: Tympanic membrane and ear canal normal.     Nose: Congestion present.     Mouth/Throat:     Mouth: Mucous membranes are moist.     Pharynx: No posterior oropharyngeal erythema.  Eyes:     Extraocular Movements: Extraocular movements intact.     Conjunctiva/sclera: Conjunctivae normal.     Pupils: Pupils are equal, round, and reactive to light.  Cardiovascular:     Rate and Rhythm: Normal rate and regular rhythm.     Pulses: Normal pulses.     Heart sounds: Normal heart sounds.  Pulmonary:     Effort: Pulmonary effort is normal. No respiratory distress.     Breath sounds: Normal breath sounds. No wheezing.  Abdominal:     General: Abdomen is flat. Bowel sounds are normal.     Palpations: Abdomen is soft.  Musculoskeletal:        General: Normal range of motion.     Cervical back: Normal range of motion.  Skin:    General: Skin is warm and dry.  Neurological:     General: No focal deficit present.     Mental Status: She is alert and oriented to person, place, and time. Mental status is at baseline.  Psychiatric:        Mood and Affect: Mood normal.        Behavior: Behavior normal.     UC Treatments / Results  Labs (all labs ordered are listed, but only abnormal results are displayed) Labs Reviewed  NOVEL CORONAVIRUS, NAA    EKG   Radiology No results found.  Procedures Procedures (including critical care time)  Medications Ordered in UC Medications - No data to display  Initial Impression / Assessment and Plan / UC Course  I have reviewed the triage vital signs and the nursing notes.  Pertinent labs & imaging results that were available during my care of the  patient were reviewed by me and considered in my medical decision making (see chart for details).     Patient  presents with symptoms likely from a viral upper respiratory infection. Differential includes bacterial pneumonia, sinusitis, allergic rhinitis, COVID-19. Do not suspect underlying cardiopulmonary process. Symptoms seem unlikely related to ACS, CHF or COPD exacerbations, pneumonia, pneumothorax. Patient is nontoxic appearing and not in need of emergent medical intervention.  COVID test pending.  Recommended symptom control with over the counter medications.  Flonase sent for patient.  Patient offered cough medication but declined.  Return if symptoms fail to improve in 1-2 weeks or you develop shortness of breath, chest pain, severe headache. Patient states understanding and is agreeable.  Discharged with PCP followup.  Final Clinical Impressions(s) / UC Diagnoses   Final diagnoses:  Viral upper respiratory tract infection with cough     Discharge Instructions      It appears that you have a viral upper respiratory infection that should self resolve in the next few days.  You have been prescribed a nasal spray that should help with congestion.  COVID test is pending.  We will call if it is positive.  Please follow-up if symptoms persist or worsen.    ED Prescriptions     Medication Sig Dispense Auth. Provider   fluticasone (FLONASE) 50 MCG/ACT nasal spray Place 1 spray into both nostrils daily for 3 days. 16 g Gustavus Bryant, [place]      PDMP not reviewed this encounter.   Gustavus Bryant, [place] 04/22/21 [phone]

## 2021-04-22 NOTE — Discharge Instructions (Signed)
It appears that you have a viral upper respiratory infection that should self resolve in the next few days.  You have been prescribed a nasal spray that should help with congestion.  COVID test is pending.  We will call if it is positive.  Please follow-up if symptoms persist or worsen.

## 2021-04-23 ENCOUNTER — Ambulatory Visit: Payer: BC Managed Care – PPO | Admitting: Nurse Practitioner

## 2021-04-23 LAB — NOVEL CORONAVIRUS, NAA: SARS-CoV-2, NAA: NOT DETECTED

## 2021-05-19 ENCOUNTER — Other Ambulatory Visit: Payer: Self-pay

## 2021-05-19 ENCOUNTER — Encounter: Payer: Self-pay | Admitting: Family Medicine

## 2021-05-19 ENCOUNTER — Ambulatory Visit (INDEPENDENT_AMBULATORY_CARE_PROVIDER_SITE_OTHER): Payer: BC Managed Care – PPO | Admitting: Family Medicine

## 2021-05-19 VITALS — BP 122/80 | HR 87 | Temp 97.8°F | Ht 59.0 in | Wt 163.0 lb

## 2021-05-19 DIAGNOSIS — E119 Type 2 diabetes mellitus without complications: Secondary | ICD-10-CM | POA: Diagnosis not present

## 2021-05-19 LAB — POCT GLYCOSYLATED HEMOGLOBIN (HGB A1C): Hemoglobin A1C: 6.4 % — AB (ref 4.0–5.6)

## 2021-05-19 NOTE — Progress Notes (Signed)
This visit occurred during the SARS-CoV-2 public health emergency.  Safety protocols were in place, including screening questions prior to the visit, additional usage of staff PPE, and extensive cleaning of exam room while observing appropriate contact time as indicated for disinfecting solutions. ? ?Diabetes:  ?No meds.   ?Hypoglycemic episodes: no  ?Hyperglycemic episodes: no ?Feet problems: no ?Blood Sugars averaging: 110 in the afternoons.   ?A1c 6.4.  This is a slightly improvement.   ? ?Her son and mother were both ill.  That and work demands have been tough on patient.  D/w pt about it, given the circumstances, A1c 6.4 is good.  Still exercising. Stressors d/w pt.   ? ?She has some midback soreness (likely postural with work)- d/w pt about trying heat locally.  No radicular pain from her back.   ? ?She has some L hip pain with prolonged sitting.  D/w pt about icing.  She can still walk for miles o/w w/o pain.   ? ?Meds, vitals, and allergies reviewed.  ? ?ROS: Per HPI unless specifically indicated in ROS section  ? ?GEN: nad, alert and oriented ?HEENT: ncat ?NECK: supple w/o LA ?CV: rrr. ?PULM: ctab, no inc wob ?ABD: soft, +bs ?EXT: no edema ?SKIN: well perfused.   ?

## 2021-05-19 NOTE — Patient Instructions (Addendum)
Plan on recheck in about 6 months at a yearly physical.  ?Thanks for your effort.  ?Take care.  Glad to see you. ?Try heat on your back and ice on your hip.   ?

## 2021-05-21 NOTE — Assessment & Plan Note (Signed)
See above.  Continue work on diet and exercise.  Recheck in about 6 months.  She agrees to plan.  I thanked her for her effort. ?

## 2021-07-14 LAB — HM MAMMOGRAPHY

## 2021-11-12 ENCOUNTER — Other Ambulatory Visit: Payer: Self-pay | Admitting: Family Medicine

## 2021-11-12 DIAGNOSIS — E559 Vitamin D deficiency, unspecified: Secondary | ICD-10-CM

## 2021-11-12 DIAGNOSIS — E119 Type 2 diabetes mellitus without complications: Secondary | ICD-10-CM

## 2021-11-14 ENCOUNTER — Other Ambulatory Visit: Payer: Self-pay

## 2021-11-21 ENCOUNTER — Other Ambulatory Visit (INDEPENDENT_AMBULATORY_CARE_PROVIDER_SITE_OTHER): Payer: BC Managed Care – PPO

## 2021-11-21 DIAGNOSIS — E119 Type 2 diabetes mellitus without complications: Secondary | ICD-10-CM

## 2021-11-21 DIAGNOSIS — E559 Vitamin D deficiency, unspecified: Secondary | ICD-10-CM | POA: Diagnosis not present

## 2021-11-21 LAB — CBC WITH DIFFERENTIAL/PLATELET
Basophils Absolute: 0 10*3/uL (ref 0.0–0.1)
Basophils Relative: 0.3 % (ref 0.0–3.0)
Eosinophils Absolute: 0.5 10*3/uL (ref 0.0–0.7)
Eosinophils Relative: 8.8 % — ABNORMAL HIGH (ref 0.0–5.0)
HCT: 40.6 % (ref 36.0–46.0)
Hemoglobin: 13.7 g/dL (ref 12.0–15.0)
Lymphocytes Relative: 29.6 % (ref 12.0–46.0)
Lymphs Abs: 1.6 10*3/uL (ref 0.7–4.0)
MCHC: 33.7 g/dL (ref 30.0–36.0)
MCV: 85.4 fl (ref 78.0–100.0)
Monocytes Absolute: 0.4 10*3/uL (ref 0.1–1.0)
Monocytes Relative: 7 % (ref 3.0–12.0)
Neutro Abs: 2.9 10*3/uL (ref 1.4–7.7)
Neutrophils Relative %: 54.3 % (ref 43.0–77.0)
Platelets: 242 10*3/uL (ref 150.0–400.0)
RBC: 4.76 Mil/uL (ref 3.87–5.11)
RDW: 13.3 % (ref 11.5–15.5)
WBC: 5.4 10*3/uL (ref 4.0–10.5)

## 2021-11-21 LAB — COMPREHENSIVE METABOLIC PANEL
ALT: 12 U/L (ref 0–35)
AST: 15 U/L (ref 0–37)
Albumin: 4.1 g/dL (ref 3.5–5.2)
Alkaline Phosphatase: 75 U/L (ref 39–117)
BUN: 19 mg/dL (ref 6–23)
CO2: 27 mEq/L (ref 19–32)
Calcium: 9.9 mg/dL (ref 8.4–10.5)
Chloride: 105 mEq/L (ref 96–112)
Creatinine, Ser: 0.9 mg/dL (ref 0.40–1.20)
GFR: 68.8 mL/min (ref >60.00)
Glucose, Bld: 137 mg/dL — ABNORMAL HIGH (ref 70–99)
Potassium: 4.2 mEq/L (ref 3.5–5.1)
Sodium: 141 mEq/L (ref 135–145)
Total Bilirubin: 0.5 mg/dL (ref 0.2–1.2)
Total Protein: 7.2 g/dL (ref 6.0–8.3)

## 2021-11-21 LAB — LIPID PANEL
Cholesterol: 182 mg/dL (ref 0–200)
HDL: 49.2 mg/dL (ref >39.00)
LDL Cholesterol: 122 mg/dL — ABNORMAL HIGH (ref 0–99)
NonHDL: 132.53
Total CHOL/HDL Ratio: 4
Triglycerides: 52 mg/dL (ref 0.0–149.0)
VLDL: 10.4 mg/dL (ref 0.0–40.0)

## 2021-11-21 LAB — VITAMIN D 25 HYDROXY (VIT D DEFICIENCY, FRACTURES): VITD: 48.54 ng/mL (ref 30.00–100.00)

## 2021-11-24 ENCOUNTER — Encounter: Payer: Self-pay | Admitting: Family Medicine

## 2021-11-24 LAB — HEMOGLOBIN A1C: Hgb A1c MFr Bld: 6.7 % — ABNORMAL HIGH (ref 4.6–6.5)

## 2021-11-25 ENCOUNTER — Encounter: Payer: Self-pay | Admitting: Family Medicine

## 2021-11-25 ENCOUNTER — Ambulatory Visit (INDEPENDENT_AMBULATORY_CARE_PROVIDER_SITE_OTHER): Payer: BC Managed Care – PPO | Admitting: Family Medicine

## 2021-11-25 VITALS — BP 120/80 | HR 85 | Temp 97.6°F | Ht 59.0 in | Wt 164.0 lb

## 2021-11-25 DIAGNOSIS — E119 Type 2 diabetes mellitus without complications: Secondary | ICD-10-CM

## 2021-11-25 DIAGNOSIS — Z0000 Encounter for general adult medical examination without abnormal findings: Secondary | ICD-10-CM | POA: Diagnosis not present

## 2021-11-25 DIAGNOSIS — M722 Plantar fascial fibromatosis: Secondary | ICD-10-CM

## 2021-11-25 DIAGNOSIS — Z1211 Encounter for screening for malignant neoplasm of colon: Secondary | ICD-10-CM

## 2021-11-25 DIAGNOSIS — K219 Gastro-esophageal reflux disease without esophagitis: Secondary | ICD-10-CM

## 2021-11-25 DIAGNOSIS — D509 Iron deficiency anemia, unspecified: Secondary | ICD-10-CM

## 2021-11-25 DIAGNOSIS — K8020 Calculus of gallbladder without cholecystitis without obstruction: Secondary | ICD-10-CM

## 2021-11-25 DIAGNOSIS — Z7189 Other specified counseling: Secondary | ICD-10-CM

## 2021-11-25 NOTE — Patient Instructions (Addendum)
Go to the lab on the way out.   If you have mychart we'll likely use that to update you.    Recheck A1c at a visit in about 6 months.   Take care.  Glad to see you. Thanks for your effort.

## 2021-11-25 NOTE — Progress Notes (Unsigned)
CPE- See plan.  Routine anticipatory guidance given to patient.  See health maintenance.  The possibility exists that previously documented standard health maintenance information may have been brought forward from a previous encounter into this note.  If needed, that same information has been updated to reflect the current situation based on today's encounter.    Tetanus 2006- she describes sig local injection site reaction.  She doesn't have high risk exposures (ie young kids for tdap).  Defer.  Discussed. PNA and shingles d/w pt.   Flu shot not indicated due to prev reaction. covid vaccine d/w pt. Mammogram and breast exam, per gyn.  d/w pt.   Pap per gyn. D/w pt.  I'll defer.   D/w patient [id number] for colon cancer screening, including IFOB vs. colonoscopy.  Risks and benefits of both were discussed and patient voiced understanding.  Pt elects for: IFOB.   Living will. D/w pt. Would have her husband designated if patient were incapacitated.   Diet and exercise d/w pt.   HIV testing prev neg during her pregnancy in 1994.   HCV neg prev.   She had family members with recent illnesses, d/w pt.  That likely affected her diet and sugar, d/w pt.    H/o DM2 d/w pt.  A1c 6.7 recently.  No meds.  D/w pt about diet and exercise.  Statin/PNA/MALB indication may not be an issue is she isn't diabetic on recheck. Sugar ~130 in the AM if she eats late the night before, occ down to 90s.    She wanted to defer cholecystectomy for now.  She has no symptoms with a low-fat diet.  Routine cautions given.  If she has any right upper quadrant pain that would prompt evaluation, discussed with patient.    Hx anemia.  off iron.  Cbc wnl, d/w pt.     GERD, controlled on prilosec.  Used about 2 times in a week or less, sometimes less.  She avoids trigger foods.   R heel pain with 1st step.  D/w pt about plantar fasciitis pain and stretching.  D/w pt about seeing Dr. Lorelei Pont is sx persist.    PMH and SH  reviewed  Meds, vitals, and allergies reviewed.   ROS: Per HPI.  Unless specifically indicated otherwise in HPI, the patient denies:  General: fever. Eyes: acute vision changes ENT: sore throat Cardiovascular: chest pain Respiratory: SOB GI: vomiting GU: dysuria Musculoskeletal: acute back pain Derm: acute rash Neuro: acute motor dysfunction Psych: worsening mood Endocrine: polydipsia Heme: bleeding Allergy: hayfever  GEN: nad, alert and oriented HEENT: ncat NECK: supple w/o LA CV: rrr. PULM: ctab, no inc wob ABD: soft, +bs EXT: no edema SKIN: no acute rash  Diabetic foot exam: Normal inspection No skin breakdown No calluses  Normal DP pulses Normal sensation to light touch and monofilament Nails normal

## 2021-11-26 NOTE — Assessment & Plan Note (Signed)
GERD, controlled on prilosec.  Used about 2 times in a week or less, sometimes less.  She avoids trigger foods.  Continue as is.

## 2021-11-26 NOTE — Assessment & Plan Note (Signed)
Living will. D/w pt. Would have her husband designated if patient were incapacitated.

## 2021-11-26 NOTE — Assessment & Plan Note (Signed)
H/o DM2 d/w pt.  A1c 6.7 recently.  No meds.  D/w pt about diet and exercise.  Statin/PNA/MALB indication may not be an issue is she isn't diabetic on recheck. Sugar ~130 in the AM if she eats late the night before, occ down to 90s.    She had family members with recent illnesses, d/w pt.  That likely affected her diet and sugar, d/w pt.    Discussed diet and exercise and recheck periodically.

## 2021-11-26 NOTE — Assessment & Plan Note (Signed)
She wanted to defer cholecystectomy for now.  She has no symptoms with a low-fat diet.  Routine cautions given.  If she has any right upper quadrant pain that would prompt evaluation, discussed with patient.

## 2021-11-26 NOTE — Assessment & Plan Note (Signed)
Tetanus 2006- she describes sig local injection site reaction.  She doesn't have high risk exposures (ie young kids for tdap).  Defer.  Discussed. PNA and shingles d/w pt.   Flu shot not indicated due to prev reaction. covid vaccine d/w pt. Mammogram and breast exam, per gyn.  d/w pt.   Pap per gyn. D/w pt.  I'll defer.   D/w patient [id number] for colon cancer screening, including IFOB vs. colonoscopy.  Risks and benefits of both were discussed and patient voiced understanding.  Pt elects for: IFOB.   Living will. D/w pt. Would have her husband designated if patient were incapacitated.   Diet and exercise d/w pt.   HIV testing prev neg during her pregnancy in 1994.   HCV neg prev.

## 2021-11-26 NOTE — Assessment & Plan Note (Signed)
Resolved

## 2021-11-26 NOTE — Assessment & Plan Note (Signed)
Discussed stretching and icing and follow-up with Dr. Lorelei Pont if not better.  She agrees.

## 2021-11-28 ENCOUNTER — Other Ambulatory Visit (INDEPENDENT_AMBULATORY_CARE_PROVIDER_SITE_OTHER): Payer: BC Managed Care – PPO | Admitting: Radiology

## 2021-11-28 DIAGNOSIS — Z1211 Encounter for screening for malignant neoplasm of colon: Secondary | ICD-10-CM

## 2021-11-28 LAB — FECAL OCCULT BLOOD, IMMUNOCHEMICAL: Fecal Occult Bld: NEGATIVE

## 2021-12-23 ENCOUNTER — Encounter: Payer: Self-pay | Admitting: Family Medicine

## 2022-03-11 ENCOUNTER — Other Ambulatory Visit: Payer: Self-pay | Admitting: Family Medicine

## 2022-06-22 ENCOUNTER — Ambulatory Visit (INDEPENDENT_AMBULATORY_CARE_PROVIDER_SITE_OTHER): Payer: BC Managed Care – PPO | Admitting: Family Medicine

## 2022-06-22 ENCOUNTER — Encounter: Payer: Self-pay | Admitting: Family Medicine

## 2022-06-22 VITALS — BP 118/80 | HR 89 | Temp 97.3°F | Ht 59.0 in | Wt 163.0 lb

## 2022-06-22 DIAGNOSIS — E119 Type 2 diabetes mellitus without complications: Secondary | ICD-10-CM

## 2022-06-22 DIAGNOSIS — L989 Disorder of the skin and subcutaneous tissue, unspecified: Secondary | ICD-10-CM | POA: Diagnosis not present

## 2022-06-22 LAB — MICROALBUMIN / CREATININE URINE RATIO
Creatinine,U: 18 mg/dL
Microalb Creat Ratio: 3.9 mg/g (ref 0.0–30.0)
Microalb, Ur: <0.7 mg/dL (ref 0.0–1.9)

## 2022-06-22 LAB — POCT GLYCOSYLATED HEMOGLOBIN (HGB A1C): Hemoglobin A1C: 6.5 % — AB (ref 4.0–5.6)

## 2022-06-22 MED ORDER — ONETOUCH VERIO VI STRP: ORAL_STRIP | 3 refills | Status: AC

## 2022-06-22 NOTE — Progress Notes (Unsigned)
Diabetes:  No meds.  Hypoglycemic episodes:  only if prolonged fasting, down to ~80s.  Resolves with snack.  Hyperglycemic episodes: no Feet problems: no Blood Sugars averaging: ~120s eye exam within last year: done last year.   A1c 6.5.    She is going to see gyn clinic this summer.    Skin lesion noted lateral L nose for 1 year.  D/w pt about derm eval.  Concern for small BCC 3mm.  Discussed.  Meds, vitals, and allergies reviewed.   ROS: Per HPI unless specifically indicated in ROS section   GEN: nad, alert and oriented HEENT: ncat NECK: supple w/o LA CV: rrr. PULM: ctab, no inc wob ABD: soft, +bs EXT: no edema SKIN: no acute rash but small papule on the L side of the nose.    Diabetic foot exam: Normal inspection No skin breakdown No calluses  Normal DP pulses Normal sensation to light touch and monofilament Nails normal

## 2022-06-22 NOTE — Patient Instructions (Addendum)
Go to the lab on the way out.   If you have mychart we'll likely use that to update you.     Please ask the front for a record release from the eye clinic.   Take care.  Glad to see you. Ask the skin clinic if they can move your appointment sooner.   Recheck at a yearly visit this fall, labs ahead of time if possible.

## 2022-06-24 NOTE — Assessment & Plan Note (Signed)
A1c 6.5.  Continue work on diet and exercise and recheck periodically.

## 2022-06-24 NOTE — Assessment & Plan Note (Signed)
Concern for small BCC 3mm.  Discussed.  I asked her to call to see if she can get her dermatology appointment moved sooner.  See after visit summary.  She can update me as needed.

## 2022-09-15 LAB — HM MAMMOGRAPHY

## 2022-09-18 ENCOUNTER — Telehealth: Payer: Self-pay | Admitting: Family Medicine

## 2022-09-18 NOTE — Telephone Encounter (Signed)
Pt called requesting a excuse letter from [place] for a jury duty summons she received in the mail. Pt stated jury duty is scheduled for 11/09/22. Pt states due to caring for her mom, she doesn't have time to attend jury duty due to not having anyone else care for her mom. Pt states there was a application attached to the summons that she will drop off to our office, so Para March can review on next week. Call back # [phone]

## 2022-09-20 NOTE — Telephone Encounter (Signed)
Please arrange a letter stating that Sara Leonard is the primary caregiver for her mother, who is a patient with ongoing medical problems including chronic back pain and memory loss.  It is not appropriate for Sara Leonard to be pulled away from caring for her mother for jury duty.  Thanks.

## 2022-09-21 ENCOUNTER — Encounter: Payer: Self-pay | Admitting: Family Medicine

## 2022-09-21 NOTE — Telephone Encounter (Signed)
Pt came in to drop off paperwork for PCP stated she need's letter with excuse for [employer] . It was place in PCP folder . [phone]

## 2022-09-21 NOTE — Telephone Encounter (Signed)
Signed. Thanks.

## 2022-09-21 NOTE — Telephone Encounter (Signed)
Called patient and notified her that the letter was done and ready for pickup

## 2022-09-21 NOTE — Telephone Encounter (Signed)
Letter done and placed in folder to sign

## 2022-09-22 LAB — HM PAP SMEAR: HPV, high-risk: NEGATIVE

## 2022-10-15 ENCOUNTER — Encounter (INDEPENDENT_AMBULATORY_CARE_PROVIDER_SITE_OTHER): Payer: Self-pay

## 2022-11-22 ENCOUNTER — Other Ambulatory Visit: Payer: Self-pay | Admitting: Family Medicine

## 2022-11-22 DIAGNOSIS — E119 Type 2 diabetes mellitus without complications: Secondary | ICD-10-CM

## 2022-11-23 ENCOUNTER — Other Ambulatory Visit (INDEPENDENT_AMBULATORY_CARE_PROVIDER_SITE_OTHER): Payer: BC Managed Care – PPO

## 2022-11-23 DIAGNOSIS — E119 Type 2 diabetes mellitus without complications: Secondary | ICD-10-CM | POA: Diagnosis not present

## 2022-11-23 LAB — COMPREHENSIVE METABOLIC PANEL
ALT: 15 U/L (ref 0–35)
AST: 17 U/L (ref 0–37)
Albumin: 4.1 g/dL (ref 3.5–5.2)
Alkaline Phosphatase: 71 U/L (ref 39–117)
BUN: 16 mg/dL (ref 6–23)
CO2: 26 mEq/L (ref 19–32)
Calcium: 9.6 mg/dL (ref 8.4–10.5)
Chloride: 105 mEq/L (ref 96–112)
Creatinine, Ser: 0.83 mg/dL (ref 0.40–1.20)
GFR: 75.29 mL/min (ref >60.00)
Glucose, Bld: 143 mg/dL — ABNORMAL HIGH (ref 70–99)
Potassium: 4.1 mEq/L (ref 3.5–5.1)
Sodium: 140 mEq/L (ref 135–145)
Total Bilirubin: 0.5 mg/dL (ref 0.2–1.2)
Total Protein: 6.9 g/dL (ref 6.0–8.3)

## 2022-11-23 LAB — HEMOGLOBIN A1C: Hgb A1c MFr Bld: 6.8 % — ABNORMAL HIGH (ref 4.6–6.5)

## 2022-11-23 LAB — LIPID PANEL
Cholesterol: 177 mg/dL (ref 0–200)
HDL: 48.9 mg/dL (ref >39.00)
LDL Cholesterol: 119 mg/dL — ABNORMAL HIGH (ref 0–99)
NonHDL: 128.52
Total CHOL/HDL Ratio: 4
Triglycerides: 47 mg/dL (ref 0.0–149.0)
VLDL: 9.4 mg/dL (ref 0.0–40.0)

## 2022-11-23 LAB — VITAMIN D 25 HYDROXY (VIT D DEFICIENCY, FRACTURES): VITD: 29.64 ng/mL — ABNORMAL LOW (ref 30.00–100.00)

## 2022-11-30 ENCOUNTER — Encounter: Payer: Self-pay | Admitting: Family Medicine

## 2022-11-30 ENCOUNTER — Ambulatory Visit (INDEPENDENT_AMBULATORY_CARE_PROVIDER_SITE_OTHER): Payer: BC Managed Care – PPO | Admitting: Family Medicine

## 2022-11-30 VITALS — BP 118/72 | HR 79 | Temp 98.7°F | Ht 59.0 in | Wt 158.0 lb

## 2022-11-30 DIAGNOSIS — Z7189 Other specified counseling: Secondary | ICD-10-CM

## 2022-11-30 DIAGNOSIS — Z0000 Encounter for general adult medical examination without abnormal findings: Secondary | ICD-10-CM

## 2022-11-30 DIAGNOSIS — M25569 Pain in unspecified knee: Secondary | ICD-10-CM

## 2022-11-30 DIAGNOSIS — E119 Type 2 diabetes mellitus without complications: Secondary | ICD-10-CM

## 2022-11-30 DIAGNOSIS — Z1211 Encounter for screening for malignant neoplasm of colon: Secondary | ICD-10-CM

## 2022-11-30 DIAGNOSIS — F411 Generalized anxiety disorder: Secondary | ICD-10-CM

## 2022-11-30 MED ORDER — VITAMIN D3 50 MCG (2000 UT) PO CAPS: 2000.0000 [IU] | ORAL_CAPSULE | Freq: Every day | ORAL | Status: AC

## 2022-11-30 NOTE — Assessment & Plan Note (Signed)
Living will. D/w pt. Would have her husband designated if patient were incapacitated.   

## 2022-11-30 NOTE — Assessment & Plan Note (Signed)
She is working on diet and exercise.  Recheck in about 4 months.  A1c at the visit.  We can address other issues, ie consider statin start, if she remains diabetic.

## 2022-11-30 NOTE — Assessment & Plan Note (Signed)
Discussed icing prn and update me as needed.

## 2022-11-30 NOTE — Patient Instructions (Addendum)
Go to the lab on the way out.   If you have mychart we'll likely use that to update you.  Take care.  Glad to see you.  Recheck in about 4 months.  A1c at the visit.  Try icing your knee for 5 minutes at a time.

## 2022-11-30 NOTE — Assessment & Plan Note (Signed)
Discussed her situation and she can update me as needed.

## 2022-11-30 NOTE — Progress Notes (Signed)
CPE- See plan.  Routine anticipatory guidance given to patient.  See health maintenance.  The possibility exists that previously documented standard health maintenance information may have been brought forward from a previous encounter into this note.  If needed, that same information has been updated to reflect the current situation based on today's encounter.    Tetanus 2006- she describes sig local injection site reaction.  She doesn't have high risk exposures (ie young kids for tdap).  Defer.  Discussed. PNA and shingles d/w pt.   Flu shot not indicated due to prev reaction. covid vaccine d/w pt. Mammogram and breast exam, per gyn.  d/w pt.   Pap per gyn. D/w pt.  I'll defer.   D/w patient [id number] for colon cancer screening, including IFOB vs. colonoscopy.  Risks and benefits of both were discussed and patient voiced understanding.  Pt elects for: IFOB.   Living will. D/w pt. Would have her husband designated if patient were incapacitated.   Diet and exercise d/w pt.   HIV testing prev neg during her pregnancy in 1994.   HCV neg prev.   Requesting GYN clinic records.    Diabetes:  No meds.  Hypoglycemic episodes: no Hyperglycemic episodes: no Feet problems: no Blood Sugars averaging: usually ~100 in the afternoons, ~150 after eating.   eye exam within last year: yes, recently done.  Requesting records.   She is working on diet and exercise.    Anxiety.  Noted that her mother has medical concerns.  Discussed.  Her brother also passed, d/w pt.  No SI/HI.    She had L knee pain after working on her feet all day.  Some knee swelling after a long day.  Discussed icing and update me as needed.    PMH and SH reviewed  Meds, vitals, and allergies reviewed.   ROS: Per HPI.  Unless specifically indicated otherwise in HPI, the patient denies:  General: fever. Eyes: acute vision changes ENT: sore throat Cardiovascular: chest pain Respiratory: SOB GI: vomiting GU:  dysuria Musculoskeletal: acute back pain Derm: acute rash Neuro: acute motor dysfunction Psych: worsening mood Endocrine: polydipsia Heme: bleeding Allergy: hayfever  GEN: nad, alert and oriented HEENT: ncat NECK: supple w/o LA CV: rrr. PULM: ctab, no inc wob ABD: soft, +bs EXT: no edema SKIN: no acute rash L knee not ttp medial or lateral joint line.  Patella not ttp. No crepitus.  Normal ROM.   Diabetic foot exam: Normal inspection No skin breakdown No calluses  Normal DP pulses Normal sensation to light touch and monofilament Nails normal

## 2022-11-30 NOTE — Assessment & Plan Note (Signed)
Tetanus 2006- she describes sig local injection site reaction.  She doesn't have high risk exposures (ie young kids for tdap).  Defer.  Discussed. PNA and shingles d/w pt.   Flu shot not indicated due to prev reaction. covid vaccine d/w pt. Mammogram and breast exam, per gyn.  d/w pt.   Pap per gyn. D/w pt.  I'll defer.   D/w patient [id number] for colon cancer screening, including IFOB vs. colonoscopy.  Risks and benefits of both were discussed and patient voiced understanding.  Pt elects for: IFOB.   Living will. D/w pt. Would have her husband designated if patient were incapacitated.   Diet and exercise d/w pt.   HIV testing prev neg during her pregnancy in 1994.   HCV neg prev.   Requesting GYN clinic records.

## 2022-12-04 ENCOUNTER — Other Ambulatory Visit: Payer: Self-pay | Admitting: Family Medicine

## 2022-12-04 DIAGNOSIS — Z1212 Encounter for screening for malignant neoplasm of rectum: Secondary | ICD-10-CM

## 2022-12-04 DIAGNOSIS — Z1211 Encounter for screening for malignant neoplasm of colon: Secondary | ICD-10-CM

## 2022-12-07 ENCOUNTER — Other Ambulatory Visit: Payer: Self-pay | Admitting: Radiology

## 2022-12-07 ENCOUNTER — Other Ambulatory Visit (INDEPENDENT_AMBULATORY_CARE_PROVIDER_SITE_OTHER): Payer: BC Managed Care – PPO | Admitting: Radiology

## 2022-12-07 DIAGNOSIS — Z1211 Encounter for screening for malignant neoplasm of colon: Secondary | ICD-10-CM | POA: Diagnosis not present

## 2022-12-08 LAB — FECAL OCCULT BLOOD, IMMUNOCHEMICAL: Fecal Occult Bld: NEGATIVE

## 2022-12-24 LAB — COLOGUARD: COLOGUARD: NEGATIVE

## 2023-02-25 ENCOUNTER — Ambulatory Visit
Admission: EM | Admit: 2023-02-25 | Discharge: 2023-02-25 | Disposition: A | Discharge status: Home / Self Care | Payer: BC Managed Care – PPO | Attending: Physician Assistant | Admitting: Physician Assistant

## 2023-02-25 DIAGNOSIS — J0100 Acute maxillary sinusitis, unspecified: Secondary | ICD-10-CM

## 2023-02-25 MED ORDER — SULFAMETHOXAZOLE-TRIMETHOPRIM 800-160 MG PO TABS
1.0000 | ORAL_TABLET | Freq: Two times a day (BID) | ORAL | 0 refills | Status: AC
Start: 2023-02-25 — End: 2023-03-04

## 2023-02-25 NOTE — ED Provider Notes (Signed)
[facility]    CSN: [id number] Arrival date & time: 02/25/23  4098      History   Chief Complaint Chief Complaint  Patient presents with   Sinus Problem    HPI Sara Leonard is a 62 y.o. female.   Patient here today for evaluation of weeklong sinus pain and pressure specifically maxillary area.  She reports she has had an occasional headache.  She denies any cough or congestion other than when she has a tickle in her throat.  She denies any fever.  She has not any other significant symptoms.  The history is provided by the patient.  Sinus Problem Pertinent negatives include no abdominal pain and no shortness of breath.    Past Medical History:  Diagnosis Date   Anemia    Anxiety    COVID-19 virus infection 03/2020   Diabetes mellitus without complication (HCC)    GERD (gastroesophageal reflux disease)    Iron deficiency    long standing, + FH and predates onset of menses   White coat hypertension     Patient Active Problem List   Diagnosis Date Noted   Skin lesion 10/20/2020   Bradycardia 05/15/2020   Prediabetes 05/22/2019   Plantar fasciitis 05/06/2018   White coat syndrome without diagnosis of hypertension 10/26/2016   Calculus of gallbladder without cholecystitis without obstruction 02/16/2015   Hepatomegaly 02/12/2015   Advance care planning 05/22/2014   Diabetes mellitus without complication (HCC) 05/22/2014   Vitamin D deficiency 05/22/2014   Routine general medical examination at a health care facility 08/01/2012   Knee pain 10/13/2010   HYPERCHOLESTEROLEMIA 09/26/2008   ANEMIA, IRON DEFICIENCY 09/26/2008   Anxiety state 09/26/2008   GERD 09/26/2008    Past Surgical History:  Procedure Laterality Date   CESAREAN SECTION     x2   TONSILLECTOMY      OB History   No obstetric history on file.      Home Medications    Prior to Admission medications   Medication Sig Start Date End Date Taking? Authorizing Provider   omeprazole (PRILOSEC) 20 MG capsule TAKE 1-2 CAPSULES BY MOUTH DAILY 03/11/22  Yes Joaquim Nam, MD  sulfamethoxazole-trimethoprim (BACTRIM DS) 800-160 MG tablet Take 1 tablet by mouth 2 (two) times daily for 7 days. 02/25/23 03/04/23 Yes Tomi Bamberger, PA-C  Cholecalciferol (VITAMIN D3) 50 MCG (2000 UT) capsule Take 1 capsule (2,000 Units total) by mouth daily. 11/30/22   Joaquim Nam, MD  glucose blood [facility] VERIO) test strip CHECK BLOOD SUGAR ONCE DAILY AS DIRECTED 06/22/22   Joaquim Nam, MD  Lancets [facility] ULTRASOFT) lancets 1 each by Other route daily. Use as instructed 11/11/20   Joaquim Nam, MD  triamcinolone cream (KENALOG) 0.1 % Apply 1 application topically 2 (two) times daily as needed. 10/18/20   Joaquim Nam, MD    Family History Family History  Problem Relation Age of Onset   Diabetes Mother    Hypertension Mother    Coronary artery disease Father    Heart attack Father    Hypertension Father    Hyperlipidemia Father    Kidney disease Father    Diabetes Father    Colon cancer Neg Hx    Breast cancer Neg Hx     Social History Social History   Tobacco Use   Smoking status: Never   Smokeless tobacco: Never  Vaping Use   Vaping status: Never Used  Substance Use Topics   Alcohol use:  No    Alcohol/week: 0.0 standard drinks of alcohol   Drug use: No     Allergies   Augmentin [amoxicillin-pot clavulanate], Azithromycin, Erythromycin, Influenza vaccines, Penicillins, Propylene glycol, and Tdap [tetanus-diphth-acell pertussis]   Review of Systems Review of Systems  Constitutional:  Negative for chills and fever.  HENT:  Positive for congestion and sinus pressure. Negative for ear pain and sore throat.   Eyes:  Negative for discharge and redness.  Respiratory:  Positive for cough. Negative for shortness of breath and wheezing.   Gastrointestinal:  Negative for abdominal pain, diarrhea, nausea and vomiting.     Physical Exam Triage  Vital Signs ED Triage Vitals  Encounter Vitals Group     BP 02/25/23 0838 (!) 173/93     Systolic BP Percentile --      Diastolic BP Percentile --      Pulse Rate 02/25/23 0838 (!) 109     Resp 02/25/23 0838 18     Temp 02/25/23 0838 98 F (36.7 C)     Temp Source 02/25/23 0838 Oral     SpO2 02/25/23 0838 96 %     Weight 02/25/23 0835 158 lb 1.1 oz (71.7 kg)     Height 02/25/23 0835 4\' 11"  (1.499 m)     Head Circumference --      Peak Flow --      Pain Score 02/25/23 0830 0     Pain Loc --      Pain Education --      Exclude from Growth Chart --    No data found.  Updated Vital Signs BP (!) 173/93 (BP Location: Right Arm) Comment: "I have white coat syndrome and normally ok, ok to recheck later if you need too".  Pulse (!) 109   Temp 98 F (36.7 C) (Oral)   Resp 18   Ht 4\' 11"  (1.499 m)   Wt 158 lb 1.1 oz (71.7 kg)   LMP 05/10/2011   SpO2 96%   BMI 31.93 kg/m   Visual Acuity Right Eye Distance:   Left Eye Distance:   Bilateral Distance:    Right Eye Near:   Left Eye Near:    Bilateral Near:     Physical Exam Vitals and nursing note reviewed.  Constitutional:      General: She is not in acute distress.    Appearance: Normal appearance. She is not ill-appearing.  HENT:     Head: Normocephalic and atraumatic.     Nose: Congestion present.     Mouth/Throat:     Mouth: Mucous membranes are moist.     Pharynx: No oropharyngeal exudate or posterior oropharyngeal erythema.  Eyes:     Conjunctiva/sclera: Conjunctivae normal.  Cardiovascular:     Rate and Rhythm: Normal rate and regular rhythm.     Heart sounds: Normal heart sounds. No murmur heard. Pulmonary:     Effort: Pulmonary effort is normal. No respiratory distress.     Breath sounds: Normal breath sounds. No wheezing, rhonchi or rales.  Skin:    General: Skin is warm and dry.  Neurological:     Mental Status: She is alert.  Psychiatric:        Mood and Affect: Mood normal.        Thought Content:  Thought content normal.      UC Treatments / Results  Labs (all labs ordered are listed, but only abnormal results are displayed) Labs Reviewed - No data to display  EKG  Radiology No results found.  Procedures Procedures (including critical care time)  Medications Ordered in UC Medications - No data to display  Initial Impression / Assessment and Plan / UC Course  I have reviewed the triage vital signs and the nursing notes.  Pertinent labs & imaging results that were available during my care of the patient were reviewed by me and considered in my medical decision making (see chart for details).    Patient reports she has issues with multiple antibiotics but is normal with Bactrim in the past.  Bactrim prescribed for sinusitis and recommended follow-up if no gradual improvement with any further concerns.  Patient expresses understanding.  Final Clinical Impressions(s) / UC Diagnoses   Final diagnoses:  Acute non-recurrent maxillary sinusitis   Discharge Instructions   None    ED Prescriptions     Medication Sig Dispense Auth. Provider   sulfamethoxazole-trimethoprim (BACTRIM DS) 800-160 MG tablet Take 1 tablet by mouth 2 (two) times daily for 7 days. 14 tablet Tomi Bamberger, PA-C      PDMP not reviewed this encounter.   Tomi Bamberger, PA-C 02/25/23 [phone]

## 2023-02-25 NOTE — ED Triage Notes (Signed)
"  This started about last Thursday, then Friday with Sinus pain/pressure and stopped up nasal passages". When I lay down at night "head and sinus pain worse". No fever. No cough.

## 2023-04-05 ENCOUNTER — Ambulatory Visit (INDEPENDENT_AMBULATORY_CARE_PROVIDER_SITE_OTHER): Payer: 59 | Admitting: Family Medicine

## 2023-04-05 ENCOUNTER — Encounter: Payer: Self-pay | Admitting: Family Medicine

## 2023-04-05 VITALS — BP 138/82 | HR 93 | Temp 98.3°F | Ht 59.0 in | Wt 166.2 lb

## 2023-04-05 DIAGNOSIS — E119 Type 2 diabetes mellitus without complications: Secondary | ICD-10-CM

## 2023-04-05 LAB — POCT GLYCOSYLATED HEMOGLOBIN (HGB A1C): Hemoglobin A1C: 6.6 % — AB (ref 4.0–5.6)

## 2023-04-05 NOTE — Assessment & Plan Note (Signed)
No change in meds, continue work on diet and exercise.  Recheck in about 4 months.  A1c at the visit.

## 2023-04-05 NOTE — Progress Notes (Signed)
Diabetes:  No meds.  Hypoglycemic episodes:no Hyperglycemic episodes:no Feet problems:no Blood Sugars averaging: 100-120.   eye exam within last year: yes A1c 6.6.  d/w pt at [facility].  Slightly better than prior.  Diet and exercise d/w pt.  Hopefully improved weather will help with exercise.    She is still helping care for her mother. She is staying with patient a variable number of nights per week.    Statin use d/w pt.  Would defer at this point since she may end up not diabetic with continued work on diet and exercise.    Meds, vitals, and allergies reviewed.  ROS: Per HPI unless specifically indicated in ROS section   GEN: nad, alert and oriented HEENT: ncat NECK: supple w/o LA CV: rrr. PULM: ctab, no inc wob ABD: +bs EXT: no edema SKIN: well perfused

## 2023-04-05 NOTE — Patient Instructions (Signed)
Take care.  Glad to see you. Recheck in about 4 months.  A1c at the visit.

## 2023-04-15 ENCOUNTER — Ambulatory Visit: Payer: 59 | Admitting: Internal Medicine

## 2023-04-16 ENCOUNTER — Encounter: Payer: Self-pay | Admitting: Family Medicine

## 2023-04-16 ENCOUNTER — Ambulatory Visit (INDEPENDENT_AMBULATORY_CARE_PROVIDER_SITE_OTHER): Payer: 59 | Admitting: Family Medicine

## 2023-04-16 VITALS — BP 122/75 | HR 99 | Temp 98.3°F | Ht 59.0 in | Wt 167.8 lb

## 2023-04-16 DIAGNOSIS — F411 Generalized anxiety disorder: Secondary | ICD-10-CM | POA: Diagnosis not present

## 2023-04-16 MED ORDER — HYDROXYZINE HCL 10 MG PO TABS: 10.0000 mg | ORAL_TABLET | Freq: Three times a day (TID) | ORAL | 1 refills | Status: AC | PRN

## 2023-04-16 NOTE — Patient Instructions (Signed)
Try using the breathing exercises and hydroxyzine if needed.  Update me as needed.  Take care.  Glad to see you.

## 2023-04-16 NOTE — Progress Notes (Signed)
BP at home- 122/75.  BP higher here.   Lower and controlled at home.    Anxiety and panic d/w pt- h/o similar in the past.  Stressors d/w pt, helping care for her mother, son.  Recently woke up from a dream and felt her heart rate elevated (~120, then up to 140 when she got worried about her heart rate).  That gradually improved, heart rate normalized. Then had another event that was similar.  Not SOB.  She can get nervous.  No syncope.  No SI/HI.  She can walk w/o CP.    Prev TSH wnl, mult times in the past.    Her son has h/o panic and he had a tough week.   D/w pt about options.  Counseling, preventive tx, abortive tx.    Meds, vitals, and allergies reviewed.  ROS: Per HPI unless specifically indicated in ROS section   GEN: nad, alert and oriented HEENT: ncat NECK: supple w/o LA CV: rrr PULM: ctab, no inc wob ABD: soft, +bs EXT: no edema SKIN: well perfused.   32 minutes were devoted to patient care in this encounter (this includes time spent reviewing the patient's file/history, interviewing and examining the patient, counseling/reviewing plan with patient).

## 2023-04-18 NOTE — Assessment & Plan Note (Signed)
With episodic panic.  Okay for outpatient follow-up. Discussed that she can try using breathing exercises and hydroxyzine if needed.  Update me as needed.  If needing hydroxyzine currently we can talk about preventative treatment.

## 2023-05-25 ENCOUNTER — Ambulatory Visit: Payer: Self-pay | Admitting: Family Medicine

## 2023-05-25 ENCOUNTER — Ambulatory Visit
Admission: RE | Admit: 2023-05-25 | Discharge: 2023-05-25 | Disposition: A | Discharge status: Home / Self Care | Source: Ambulatory Visit | Attending: Family Medicine | Admitting: Family Medicine

## 2023-05-25 VITALS — BP 174/95 | HR 94 | Temp 98.5°F | Resp 18 | Ht 59.0 in | Wt 167.8 lb

## 2023-05-25 DIAGNOSIS — J069 Acute upper respiratory infection, unspecified: Secondary | ICD-10-CM | POA: Diagnosis not present

## 2023-05-25 LAB — POC COVID19/FLU A&B COMBO
Covid Antigen, POC: NEGATIVE
Influenza A Antigen, POC: NEGATIVE
Influenza B Antigen, POC: NEGATIVE

## 2023-05-25 NOTE — Telephone Encounter (Signed)
 Agree. Thanks

## 2023-05-25 NOTE — ED Provider Notes (Signed)
 [facility]    CSN: [id number] Arrival date & time: 05/25/23  1449      History   Chief Complaint Chief Complaint  Patient presents with   Cough    Entered by patient   Sore Throat   Nasal Congestion    HPI Sara Leonard is a 63 y.o. female.  Patient here for evaluation of cough, sore throat, and nasal congestion. Drinking fluids and taking Advil  Reports that she has not had fever, denies any known sick contacts.  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  Would like a COVID test to rule out infective illness as a source of her symptoms.   Past Medical History:  Diagnosis Date   Anemia    Anxiety    COVID-19 virus infection 03/2020   Diabetes mellitus without complication (HCC)    GERD (gastroesophageal reflux disease)    Iron deficiency    long standing, + FH and predates onset of menses   White coat hypertension     Patient Active Problem List   Diagnosis Date Noted   Skin lesion 10/20/2020   Bradycardia 05/15/2020   Plantar fasciitis 05/06/2018   White coat syndrome without diagnosis of hypertension 10/26/2016   Calculus of gallbladder without cholecystitis without obstruction 02/16/2015   Hepatomegaly 02/12/2015   Advance care planning 05/22/2014   Diabetes mellitus without complication (HCC) 05/22/2014   Vitamin D deficiency 05/22/2014   Routine general medical examination at a health care facility 08/01/2012   Knee pain 10/13/2010   HYPERCHOLESTEROLEMIA 09/26/2008   ANEMIA, IRON DEFICIENCY 09/26/2008   Anxiety state 09/26/2008   GERD 09/26/2008    Past Surgical History:  Procedure Laterality Date   CESAREAN SECTION     x2   TONSILLECTOMY      OB History   No obstetric history on file.      Home Medications    Prior to Admission medications   Medication Sig Start Date End Date Taking? Authorizing Provider  Cholecalciferol (VITAMIN D3) 50 MCG (2000 UT) capsule  Take 1 capsule (2,000 Units total) by mouth daily. 11/30/22  Yes Joaquim Nam, MD  omeprazole (PRILOSEC) 20 MG capsule TAKE 1-2 CAPSULES BY MOUTH DAILY 03/11/22  Yes Joaquim Nam, MD  glucose blood (ONETOUCH VERIO) test strip CHECK BLOOD SUGAR ONCE DAILY AS DIRECTED 06/22/22   Joaquim Nam, MD  hydrOXYzine (ATARAX) 10 MG tablet Take 1 tablet (10 mg total) by mouth 3 (three) times daily as needed for anxiety. 04/16/23   Joaquim Nam, MD  Lancets [facility] ULTRASOFT) lancets 1 each by Other route daily. Use as instructed 11/11/20   Joaquim Nam, MD  triamcinolone cream (KENALOG) 0.1 % Apply 1 application topically 2 (two) times daily as needed. 10/18/20   Joaquim Nam, MD    Family History Family History  Problem Relation Age of Onset   Diabetes Mother    Hypertension Mother    Coronary artery disease Father    Heart attack Father    Hypertension Father    Hyperlipidemia Father    Kidney disease Father    Diabetes Father    Colon cancer Neg Hx    Breast cancer Neg Hx     Social History Social History   Tobacco Use   Smoking status: Never   Smokeless tobacco: Never  Vaping Use   Vaping status: Never Used  Substance Use Topics  Alcohol use: No    Alcohol/week: 0.0 standard drinks of alcohol   Drug use: No     Allergies   Augmentin [amoxicillin-pot clavulanate], Azithromycin, Erythromycin, Influenza vaccines, Penicillins, Propylene glycol, and Tdap [tetanus-diphth-acell pertussis]   Review of Systems Review of Systems  Respiratory:  Positive for cough.      Physical Exam Triage Vital Signs ED Triage Vitals  Encounter Vitals Group     BP 05/25/23 1510 (!) 174/95     Systolic BP Percentile --      Diastolic BP Percentile --      Pulse Rate 05/25/23 1510 94     Resp 05/25/23 1510 18     Temp 05/25/23 1510 98.5 F (36.9 C)     Temp Source 05/25/23 1510 Oral     SpO2 05/25/23 1510 96 %     Weight 05/25/23 1508 167 lb 12.3 oz (76.1 kg)     Height  05/25/23 1508 4\' 11"  (1.499 m)     Head Circumference --      Peak Flow --      Pain Score 05/25/23 1506 0     Pain Loc --      Pain Education --      Exclude from Growth Chart --    No data found.  Updated Vital Signs BP (!) 174/95 (BP Location: Left Arm) Comment: "I have white coat syndrome, @ home they were fine earlier"  Pulse 94   Temp 98.5 F (36.9 C) (Oral)   Resp 18   Ht 4\' 11"  (1.499 m)   Wt 167 lb 12.3 oz (76.1 kg)   LMP 05/10/2011   SpO2 96%   BMI 33.89 kg/m   Visual Acuity Right Eye Distance:   Left Eye Distance:   Bilateral Distance:    Right Eye Near:   Left Eye Near:    Bilateral Near:     Physical Exam Vitals reviewed.  Constitutional:      Appearance: She is well-developed. She is not ill-appearing.  HENT:     Head: Normocephalic and atraumatic.  Cardiovascular:     Rate and Rhythm: Normal rate and regular rhythm.  Pulmonary:     Effort: Pulmonary effort is normal.     Breath sounds: Normal breath sounds.  Musculoskeletal:     Cervical back: Normal range of motion and neck supple.  Skin:    Capillary Refill: Capillary refill takes less than 2 seconds.  Neurological:     General: No focal deficit present.     Mental Status: She is alert.      UC Treatments / Results  Labs (all labs ordered are listed, but only abnormal results are displayed) Labs Reviewed  POC COVID19/FLU A&B COMBO - Normal    EKG   Radiology No results found.  Procedures Procedures (including critical care time)  Medications Ordered in UC Medications - No data to display  Initial Impression / Assessment and Plan / UC Course  I have reviewed the triage vital signs and the nursing notes.  Pertinent labs & imaging results that were available during my care of the patient were reviewed by me and considered in my medical decision making (see chart for details).     Rapid COVID and flu test both negative.  Discussed with patient options of prescribing some  symptom treatment patient declined any prescription medication will continue with her home meds for symptoms.  Patient encouraged to return if symptoms worsen or do not improve. Final Clinical Impressions(s) /  UC Diagnoses   Final diagnoses:  Viral URI   Discharge Instructions   None    ED Prescriptions   None    PDMP not reviewed this encounter.   Bing Neighbors, NP 05/25/23 [phone]

## 2023-05-25 NOTE — ED Triage Notes (Signed)
"  This started Sunday with scratchy throat, then by Sunday a sore throat (burning), then this continued to today (without burning), then head congestion, stuffy nose, productive cough with ha all day today". "I did notice some chills last night, flushed".

## 2023-05-25 NOTE — Telephone Encounter (Signed)
 Copied from CRM [id number]. Topic: Appointments - Appointment Scheduling >> May 25, 2023  9:53 AM Alcus Dad wrote: Patient/patient representative is calling to schedule an appointment.Patient has a burning sensation in her throat with a really bad cough. Patient also has headaches and had fever. 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 Patient stated symptoms started on Sunday. Patient reported productive cough, sore throat, nasal drainage and head congestion. Patient is also running a low grade fever. Patient denied SOB and chest pain. 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 Patient is requesting to be seen today. No availability with PCP or PCP office today. This RN offered to schedule patient with an alternate office. Patient declined and stated she would rather go to UC. This RN assisted patient with making an appointment at [facility]. This RN advised patient to call back if symptoms worsen. Patient complied.   Reason for Disposition  [1] Continuous (nonstop) coughing interferes with work or school AND [2] no improvement using cough treatment per Care Advice  Answer Assessment - Initial Assessment Questions 1. ONSET: "When did the cough begin?"      Sunday 2. SEVERITY: "How bad is the cough today?"      States she has sporadic coughing spells 3. SPUTUM: "Describe the color of your sputum" (none, dry cough; clear, white, yellow, green)      Clear 4. HEMOPTYSIS: "Are you coughing up any blood?" If so ask: "How much?" (flecks, streaks, tablespoons, etc.)     Denies 5. DIFFICULTY BREATHING: "Are you having difficulty breathing?" If Yes, ask: "How bad is it?" (e.g., mild, moderate, severe)    - MILD: No SOB at rest, mild SOB with walking, speaks normally in sentences, can lie down, no retractions, pulse < 100.    - MODERATE: SOB at rest, SOB with minimal exertion and prefers to sit, cannot lie down flat, speaks in phrases, mild retractions, audible wheezing, pulse 100-120.    - SEVERE: Very SOB at rest, speaks in single words, struggling to breathe, sitting hunched forward, retractions, pulse > 120      Denies 6. FEVER: "Do you have a fever?" If Yes, ask: "What is your temperature, how was it measured, and when did it start?"     Feels "flush", 99.1-99.3 this morning, states fevers always run low 7. CARDIAC HISTORY: "Do you have any history of heart disease?" (e.g., heart attack, congestive heart failure)      Denies 8. LUNG HISTORY: "Do you have any history of lung disease?"  (e.g., pulmonary embolus, asthma, emphysema)     Denies 10. OTHER SYMPTOMS: "Do you have any other symptoms?" (e.g., runny nose, wheezing, chest pain)     Sore/burning throat, head congestion, nasal drainage, denies chest pain, denies wheezing  Protocols used: Cough - Acute Productive-A-AH

## 2023-06-21 ENCOUNTER — Other Ambulatory Visit: Payer: Self-pay

## 2023-06-21 MED ORDER — OMEPRAZOLE 20 MG PO CPDR: DELAYED_RELEASE_CAPSULE | ORAL | 3 refills | Status: AC

## 2023-08-09 ENCOUNTER — Ambulatory Visit: Payer: 59 | Admitting: Family Medicine

## 2023-08-30 ENCOUNTER — Ambulatory Visit: Admitting: Family Medicine

## 2023-09-17 ENCOUNTER — Encounter: Payer: Self-pay | Admitting: Advanced Practice Midwife

## 2023-10-08 ENCOUNTER — Ambulatory Visit: Admitting: Family Medicine

## 2023-10-15 ENCOUNTER — Ambulatory Visit: Admitting: Family Medicine

## 2023-10-29 ENCOUNTER — Other Ambulatory Visit (INDEPENDENT_AMBULATORY_CARE_PROVIDER_SITE_OTHER)

## 2023-10-29 ENCOUNTER — Ambulatory Visit (INDEPENDENT_AMBULATORY_CARE_PROVIDER_SITE_OTHER): Admitting: Family Medicine

## 2023-10-29 DIAGNOSIS — E119 Type 2 diabetes mellitus without complications: Secondary | ICD-10-CM

## 2023-10-29 LAB — POCT GLYCOSYLATED HEMOGLOBIN (HGB A1C): Hemoglobin A1C: 6.9 % — AB (ref 4.0–5.6)

## 2023-10-29 NOTE — Progress Notes (Signed)
 Husband recently in hospital.  Checked pt's A1c 6.9 at his appointment today.  D/w pt about deferring her OV given her recent family events.  No change in meds based on A1c.  Can recheck in about 4 months.  She agrees. No charge for visit.

## 2023-10-29 NOTE — Assessment & Plan Note (Signed)
 Husband recently in hospital.  Checked pt's A1c 6.9 at his appointment today.  D/w pt about deferring her OV given her recent family events.  No change in meds based on A1c.  Can recheck in about 4 months.  She agrees. No charge for visit.

## 2023-11-11 ENCOUNTER — Telehealth: Payer: Self-pay

## 2023-11-11 DIAGNOSIS — E119 Type 2 diabetes mellitus without complications: Secondary | ICD-10-CM

## 2023-11-11 DIAGNOSIS — E559 Vitamin D deficiency, unspecified: Secondary | ICD-10-CM

## 2023-11-11 NOTE — Telephone Encounter (Signed)
 Ordered. Thanks

## 2023-11-11 NOTE — Telephone Encounter (Signed)
 Copied from CRM #[id number]. Topic: Clinical - Request for Lab/Test Order >> Nov 11, 2023  1:22 PM Robinson H wrote: Reason for CRM: Patient wants to have labs done prior to her physical scheduled for 10/17, wants Vitamin B & D checked also.  Joen [phone]

## 2023-11-12 NOTE — Telephone Encounter (Signed)
 Please call patient and schedule her for lab appointment prior to her physical. Lab orders has been placed

## 2023-12-10 ENCOUNTER — Other Ambulatory Visit (INDEPENDENT_AMBULATORY_CARE_PROVIDER_SITE_OTHER)

## 2023-12-10 DIAGNOSIS — E559 Vitamin D deficiency, unspecified: Secondary | ICD-10-CM

## 2023-12-10 DIAGNOSIS — E119 Type 2 diabetes mellitus without complications: Secondary | ICD-10-CM | POA: Diagnosis not present

## 2023-12-10 LAB — CBC WITH DIFFERENTIAL/PLATELET
Basophils Absolute: 0.1 K/uL (ref 0.0–0.1)
Basophils Relative: 0.9 % (ref 0.0–3.0)
Eosinophils Absolute: 0.5 K/uL (ref 0.0–0.7)
Eosinophils Relative: 8.2 % — ABNORMAL HIGH (ref 0.0–5.0)
HCT: 42.5 % (ref 36.0–46.0)
Hemoglobin: 14.2 g/dL (ref 12.0–15.0)
Lymphocytes Relative: 28.2 % (ref 12.0–46.0)
Lymphs Abs: 1.7 K/uL (ref 0.7–4.0)
MCHC: 33.5 g/dL (ref 30.0–36.0)
MCV: 85.1 fl (ref 78.0–100.0)
Monocytes Absolute: 0.4 K/uL (ref 0.1–1.0)
Monocytes Relative: 6.6 % (ref 3.0–12.0)
Neutro Abs: 3.3 K/uL (ref 1.4–7.7)
Neutrophils Relative %: 56.1 % (ref 43.0–77.0)
Platelets: 246 K/uL (ref 150.0–400.0)
RBC: 4.99 Mil/uL (ref 3.87–5.11)
RDW: 13.2 % (ref 11.5–15.5)
WBC: 5.9 K/uL (ref 4.0–10.5)

## 2023-12-10 LAB — MICROALBUMIN / CREATININE URINE RATIO
Creatinine,U: 20.5 mg/dL
Microalb Creat Ratio: UNDETERMINED mg/g (ref 0.0–30.0)
Microalb, Ur: <0.7 mg/dL

## 2023-12-10 LAB — VITAMIN D 25 HYDROXY (VIT D DEFICIENCY, FRACTURES): VITD: 27.63 ng/mL — ABNORMAL LOW (ref 30.00–100.00)

## 2023-12-10 LAB — TSH: TSH: 1.47 u[IU]/mL (ref 0.35–5.50)

## 2023-12-10 LAB — COMPREHENSIVE METABOLIC PANEL WITH GFR
ALT: 13 U/L (ref 0–35)
AST: 14 U/L (ref 0–37)
Albumin: 4.4 g/dL (ref 3.5–5.2)
Alkaline Phosphatase: 79 U/L (ref 39–117)
BUN: 16 mg/dL (ref 6–23)
CO2: 27 meq/L (ref 19–32)
Calcium: 9.6 mg/dL (ref 8.4–10.5)
Chloride: 104 meq/L (ref 96–112)
Creatinine, Ser: 0.81 mg/dL (ref 0.40–1.20)
GFR: 76.96 mL/min (ref >60.00)
Glucose, Bld: 165 mg/dL — ABNORMAL HIGH (ref 70–99)
Potassium: 4.7 meq/L (ref 3.5–5.1)
Sodium: 139 meq/L (ref 135–145)
Total Bilirubin: 0.5 mg/dL (ref 0.2–1.2)
Total Protein: 6.8 g/dL (ref 6.0–8.3)

## 2023-12-10 LAB — VITAMIN B12: Vitamin B-12: 374 pg/mL (ref 211–911)

## 2023-12-10 LAB — LIPID PANEL
Cholesterol: 180 mg/dL (ref 0–200)
HDL: 47.1 mg/dL (ref >39.00)
LDL Cholesterol: 123 mg/dL — ABNORMAL HIGH (ref 0–99)
NonHDL: 132.85
Total CHOL/HDL Ratio: 4
Triglycerides: 48 mg/dL (ref 0.0–149.0)
VLDL: 9.6 mg/dL (ref 0.0–40.0)

## 2023-12-10 LAB — HEMOGLOBIN A1C: Hgb A1c MFr Bld: 7.3 % — ABNORMAL HIGH (ref 4.6–6.5)

## 2023-12-12 ENCOUNTER — Ambulatory Visit: Payer: Self-pay | Admitting: Family Medicine

## 2023-12-17 ENCOUNTER — Ambulatory Visit (INDEPENDENT_AMBULATORY_CARE_PROVIDER_SITE_OTHER): Admitting: Family Medicine

## 2023-12-17 ENCOUNTER — Encounter: Payer: Self-pay | Admitting: Family Medicine

## 2023-12-17 VITALS — BP 124/64 | HR 74 | Temp 98.1°F | Ht 59.69 in | Wt 161.4 lb

## 2023-12-17 DIAGNOSIS — E559 Vitamin D deficiency, unspecified: Secondary | ICD-10-CM

## 2023-12-17 DIAGNOSIS — F411 Generalized anxiety disorder: Secondary | ICD-10-CM

## 2023-12-17 DIAGNOSIS — K219 Gastro-esophageal reflux disease without esophagitis: Secondary | ICD-10-CM

## 2023-12-17 DIAGNOSIS — Z7189 Other specified counseling: Secondary | ICD-10-CM

## 2023-12-17 DIAGNOSIS — Z0000 Encounter for general adult medical examination without abnormal findings: Secondary | ICD-10-CM | POA: Diagnosis not present

## 2023-12-17 DIAGNOSIS — E119 Type 2 diabetes mellitus without complications: Secondary | ICD-10-CM

## 2023-12-17 NOTE — Patient Instructions (Addendum)
 Please ask the front for a record request from the eye clinic and from [facility] from this year.   Take care.  Glad to see you.  Recheck in Feb 2026, labs (vit D and A1c) at the visit.  You don't need to fast.  Keep taking vitamin D .

## 2023-12-17 NOTE — Progress Notes (Unsigned)
 CPE- See plan.  Routine anticipatory guidance given to patient.  See health maintenance.  The possibility exists that previously documented standard health maintenance information may have been brought forward from a previous encounter into this note.  If needed, that same information has been updated to reflect the current situation based on today's encounter.     Tetanus 2006- she describes sig local injection site reaction.  Defer.  Discussed. PNA and shingles d/w pt.   Flu shot not indicated due to prev reaction. covid vaccine d/w pt. Mammogram and breast exam, per gyn.  d/w pt.   Pap per gyn. D/w pt.  I'll defer.   Cologuard neg 2024.  Living will. D/w pt. Would have her husband designated if patient were incapacitated.   Diet and exercise d/w pt.   HIV testing prev neg during her pregnancy in 1994.   HCV neg prev.   Vit D low.  Restarted in the meantime.  She had stopped med prev.     Diabetes:  No meds.  Hypoglycemic episodes: no Hyperglycemic episodes: no Feet problems: no Blood Sugars averaging: ~120-140s   eye exam within last year: yes, recently done.  Requesting records.   She is working on diet and exercise.  Labs d/w pt.     Anxiety.  Noted that her mother has medical concerns.  Discussed.  No SI/HI.  Had not used hydroxyzine  prn.  GERD controlled with PPI. No ADE on med.  Compliant.  Meds, vitals, and allergies reviewed.   ROS: Per HPI unless specifically indicated in ROS section   GEN: nad, alert and oriented HEENT: mucous membranes moist NECK: supple w/o LA CV: rrr PULM: ctab, no inc wob ABD: soft, +bs EXT: no edema SKIN: no acute rash but healing scrape on the R forearm.    Diabetic foot exam: Normal inspection No skin breakdown No calluses  Normal DP pulses Normal sensation to light touch and monofilament Nails normal

## 2023-12-19 NOTE — Assessment & Plan Note (Signed)
Living will. D/w pt. Would have her husband designated if patient were incapacitated.   

## 2023-12-19 NOTE — Assessment & Plan Note (Signed)
 Recheck in Feb 2026, labs (vit D and A1c) at the visit.  She is working on diet and exercise.  Labs d/w pt.  No change in meds.

## 2023-12-19 NOTE — Assessment & Plan Note (Signed)
 Tetanus 2006- she describes sig local injection site reaction.  Defer.  Discussed. PNA and shingles d/w pt.   Flu shot not indicated due to prev reaction. covid vaccine d/w pt. Mammogram and breast exam, per gyn.  d/w pt.   Pap per gyn. D/w pt.  I'll defer.   Cologuard neg 2024.  Living will. D/w pt. Would have her husband designated if patient were incapacitated.   Diet and exercise d/w pt.   HIV testing prev neg during her pregnancy in 1994.   HCV neg prev.

## 2023-12-19 NOTE — Assessment & Plan Note (Signed)
 Noted that her mother has medical concerns.  Discussed.  No SI/HI.  Had not used hydroxyzine  prn.  She could use that if needed.  Update me as needed.

## 2023-12-19 NOTE — Assessment & Plan Note (Signed)
Continue with omeprazole 

## 2023-12-19 NOTE — Assessment & Plan Note (Signed)
 Vit D low.  Restarted in the meantime.  She had stopped med prev.   Recheck in Feb 2026, labs (vit D and A1c) at the visit.  Keep taking vitamin D  as is.

## 2024-03-06 ENCOUNTER — Ambulatory Visit: Admitting: Family Medicine

## 2024-04-10 ENCOUNTER — Ambulatory Visit: Admitting: Family Medicine

## 2024-05-01 ENCOUNTER — Ambulatory Visit: Admitting: Family Medicine
